# Patient Record
Sex: Male | Born: 1980 | Race: Black or African American | Hispanic: No | Smoking: Never smoker
Health system: Southern US, Community
[De-identification: ages and names within clinical notes are randomized; demographics above are authoritative.]

## PROBLEM LIST (undated history)

## (undated) DIAGNOSIS — E7800 Pure hypercholesterolemia, unspecified: Secondary | ICD-10-CM

## (undated) DIAGNOSIS — C3490 Malignant neoplasm of unspecified part of unspecified bronchus or lung: Secondary | ICD-10-CM

## (undated) DIAGNOSIS — Z87442 Personal history of urinary calculi: Secondary | ICD-10-CM

## (undated) DIAGNOSIS — D3A090 Benign carcinoid tumor of the bronchus and lung: Secondary | ICD-10-CM

## (undated) DIAGNOSIS — F419 Anxiety disorder, unspecified: Secondary | ICD-10-CM

## (undated) DIAGNOSIS — C801 Malignant (primary) neoplasm, unspecified: Secondary | ICD-10-CM

## (undated) DIAGNOSIS — K760 Fatty (change of) liver, not elsewhere classified: Secondary | ICD-10-CM

## (undated) DIAGNOSIS — J45909 Unspecified asthma, uncomplicated: Secondary | ICD-10-CM

## (undated) DIAGNOSIS — F32A Depression, unspecified: Secondary | ICD-10-CM

## (undated) HISTORY — DX: Malignant neoplasm of unspecified part of unspecified bronchus or lung: C34.90

## (undated) HISTORY — PX: WISDOM TOOTH EXTRACTION: SHX21

## (undated) HISTORY — DX: Benign carcinoid tumor of the bronchus and lung: D3A.090

---
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
## 2001-05-01 ENCOUNTER — Emergency Department (HOSPITAL_COMMUNITY): Admission: EM | Admit: 2001-05-01 | Discharge: 2001-05-01 | Payer: Self-pay | Admitting: *Deleted

## 2001-11-05 ENCOUNTER — Encounter: Admission: RE | Admit: 2001-11-05 | Discharge: 2001-11-05 | Payer: Self-pay | Admitting: Family Medicine

## 2001-11-05 ENCOUNTER — Encounter: Payer: Self-pay | Admitting: Family Medicine

## 2003-06-03 ENCOUNTER — Encounter: Admission: RE | Admit: 2003-06-03 | Discharge: 2003-06-03 | Payer: Self-pay | Admitting: Family Medicine

## 2004-03-17 ENCOUNTER — Encounter: Admission: RE | Admit: 2004-03-17 | Discharge: 2004-03-17 | Payer: Self-pay | Admitting: Family Medicine

## 2009-03-22 ENCOUNTER — Emergency Department (HOSPITAL_COMMUNITY): Admission: EM | Admit: 2009-03-22 | Discharge: 2009-03-23 | Payer: Self-pay | Admitting: Emergency Medicine

## 2010-05-16 LAB — URINALYSIS, ROUTINE W REFLEX MICROSCOPIC
Bilirubin Urine: NEGATIVE
Glucose, UA: NEGATIVE mg/dL
Hgb urine dipstick: NEGATIVE
Protein, ur: NEGATIVE mg/dL
Specific Gravity, Urine: 1.027 (ref 1.005–1.030)

## 2010-05-16 LAB — CBC
MCHC: 33.2 g/dL (ref 30.0–36.0)
Platelets: 151 10*3/uL (ref 150–400)
RDW: 12.8 % (ref 11.5–15.5)

## 2010-05-16 LAB — COMPREHENSIVE METABOLIC PANEL
ALT: 97 U/L — ABNORMAL HIGH (ref 0–53)
Albumin: 4.4 g/dL (ref 3.5–5.2)
Alkaline Phosphatase: 73 U/L (ref 39–117)
Calcium: 9.4 mg/dL (ref 8.4–10.5)
GFR calc Af Amer: >60 mL/min (ref >60)
Glucose, Bld: 103 mg/dL — ABNORMAL HIGH (ref 70–99)
Potassium: 3.8 mEq/L (ref 3.5–5.1)
Sodium: 140 mEq/L (ref 135–145)
Total Protein: 7.7 g/dL (ref 6.0–8.3)

## 2010-05-16 LAB — DIFFERENTIAL
Eosinophils Absolute: 0 10*3/uL (ref 0.0–0.7)
Lymphs Abs: 0.3 10*3/uL — ABNORMAL LOW (ref 0.7–4.0)
Monocytes Absolute: 0.4 10*3/uL (ref 0.1–1.0)
Monocytes Relative: 5 % (ref 3–12)
Neutrophils Relative %: 90 % — ABNORMAL HIGH (ref 43–77)

## 2012-08-31 DIAGNOSIS — R030 Elevated blood-pressure reading, without diagnosis of hypertension: Secondary | ICD-10-CM | POA: Insufficient documentation

## 2012-08-31 DIAGNOSIS — R5381 Other malaise: Secondary | ICD-10-CM | POA: Insufficient documentation

## 2012-08-31 DIAGNOSIS — J060 Acute laryngopharyngitis: Secondary | ICD-10-CM | POA: Insufficient documentation

## 2013-01-17 DIAGNOSIS — Z639 Problem related to primary support group, unspecified: Secondary | ICD-10-CM | POA: Insufficient documentation

## 2013-01-17 DIAGNOSIS — J0100 Acute maxillary sinusitis, unspecified: Secondary | ICD-10-CM | POA: Insufficient documentation

## 2013-06-03 DIAGNOSIS — J309 Allergic rhinitis, unspecified: Secondary | ICD-10-CM | POA: Insufficient documentation

## 2013-07-25 DIAGNOSIS — J029 Acute pharyngitis, unspecified: Secondary | ICD-10-CM | POA: Insufficient documentation

## 2013-07-25 DIAGNOSIS — J45909 Unspecified asthma, uncomplicated: Secondary | ICD-10-CM | POA: Insufficient documentation

## 2013-08-14 DIAGNOSIS — K921 Melena: Secondary | ICD-10-CM | POA: Insufficient documentation

## 2013-08-14 DIAGNOSIS — M5430 Sciatica, unspecified side: Secondary | ICD-10-CM | POA: Insufficient documentation

## 2014-02-11 DIAGNOSIS — G479 Sleep disorder, unspecified: Secondary | ICD-10-CM | POA: Insufficient documentation

## 2015-04-22 DIAGNOSIS — H60541 Acute eczematoid otitis externa, right ear: Secondary | ICD-10-CM | POA: Insufficient documentation

## 2015-04-22 DIAGNOSIS — J301 Allergic rhinitis due to pollen: Secondary | ICD-10-CM | POA: Insufficient documentation

## 2015-05-20 DIAGNOSIS — H60313 Diffuse otitis externa, bilateral: Secondary | ICD-10-CM | POA: Insufficient documentation

## 2015-08-30 DIAGNOSIS — S39012A Strain of muscle, fascia and tendon of lower back, initial encounter: Secondary | ICD-10-CM | POA: Insufficient documentation

## 2015-08-30 DIAGNOSIS — M25511 Pain in right shoulder: Secondary | ICD-10-CM | POA: Insufficient documentation

## 2015-09-21 DIAGNOSIS — F5232 Male orgasmic disorder: Secondary | ICD-10-CM | POA: Insufficient documentation

## 2015-09-21 DIAGNOSIS — Z0000 Encounter for general adult medical examination without abnormal findings: Secondary | ICD-10-CM | POA: Insufficient documentation

## 2015-09-23 DIAGNOSIS — E7800 Pure hypercholesterolemia, unspecified: Secondary | ICD-10-CM | POA: Insufficient documentation

## 2015-09-29 ENCOUNTER — Ambulatory Visit: Payer: Self-pay

## 2015-09-29 ENCOUNTER — Other Ambulatory Visit: Payer: Self-pay | Admitting: Occupational Medicine

## 2015-09-29 DIAGNOSIS — M79644 Pain in right finger(s): Secondary | ICD-10-CM

## 2018-10-01 ENCOUNTER — Other Ambulatory Visit: Payer: Self-pay

## 2018-10-01 ENCOUNTER — Ambulatory Visit
Admission: EM | Admit: 2018-10-01 | Discharge: 2018-10-01 | Disposition: A | Discharge status: Home / Self Care | Payer: BC Managed Care – PPO | Attending: Physician Assistant | Admitting: Physician Assistant

## 2018-10-01 ENCOUNTER — Encounter: Payer: Self-pay | Admitting: Emergency Medicine

## 2018-10-01 DIAGNOSIS — D225 Melanocytic nevi of trunk: Secondary | ICD-10-CM | POA: Diagnosis not present

## 2018-10-01 HISTORY — DX: Fatty (change of) liver, not elsewhere classified: K76.0

## 2018-10-01 HISTORY — DX: Pure hypercholesterolemia, unspecified: E78.00

## 2018-10-01 NOTE — ED Provider Notes (Signed)
[name]    [name]: [id number] Arrival date & time: 10/01/18  1506      History   Chief Complaint Chief Complaint  Patient presents with   Mass    HPI Derek Fitzgerald is a 38 y.o. male.   38 year old male comes in for evaluation of mole to the groin area.  States he recently noticed the mole, unsure original size, but feels that it may be slightly larger.  Area is more tender to palpation recently.  He states given father passed away from prostate cancer, wanted to be evaluated for skin cancer.  He denies any injury/trauma to the area.  Denies erythema, warmth, swelling.  Denies sun exposure to the area.  Denies personal or family history of skin cancer.     Past Medical History:  Diagnosis Date   Fatty liver    Hypercholesteremia     There are no active problems to display for this patient.   History reviewed. No pertinent surgical history.     Home Medications    Prior to Admission medications   Not on File    Family History Family History  Problem Relation Age of Onset   Prostate cancer Father     Social History Social History   Tobacco Use   Smoking status: Never Smoker   Smokeless tobacco: Never Used  Substance Use Topics   Alcohol use: Not Currently    Comment: socially   Drug use: Never     Allergies   Patient has no known allergies.   Review of Systems Review of Systems  Reason unable to perform ROS: See HPI as above.     Physical Exam Triage Vital Signs ED Triage Vitals  Enc Vitals Group     BP 10/01/18 1516 (!) 153/111     Pulse Rate 10/01/18 1516 88     Resp 10/01/18 1516 17     Temp 10/01/18 1516 98.4 F (36.9 C)     Temp Source 10/01/18 1516 Oral     SpO2 10/01/18 1516 95 %     Weight --      Height --      Head Circumference --      Peak Flow --      Pain Score 10/01/18 1515 1     Pain Loc --      Pain Edu? --      Excl. in [place]? --    No data found.  Updated Vital Signs BP (!) 153/111 (BP  Location: Right Arm)    Pulse 88    Temp 98.4 F (36.9 C) (Oral)    Resp 17    SpO2 95%   Visual Acuity Right Eye Distance:   Left Eye Distance:   Bilateral Distance:    Right Eye Near:   Left Eye Near:    Bilateral Near:     Physical Exam Constitutional:      General: He is not in acute distress.    Appearance: He is well-developed. He is not diaphoretic.  HENT:     Head: Normocephalic and atraumatic.  Eyes:     Conjunctiva/sclera: Conjunctivae normal.     Pupils: Pupils are equal, round, and reactive to light.  Pulmonary:     Effort: Pulmonary effort is normal. No respiratory distress.  Genitourinary:   Skin:    General: Skin is warm and dry.  Neurological:     Mental Status: He is alert and oriented to person, place, and time.  UC Treatments / Results  Labs (all labs ordered are listed, but only abnormal results are displayed) Labs Reviewed - No data to display  EKG   Radiology No results found.  Procedures Procedures (including critical care time)  Medications Ordered in UC Medications - No data to display  Initial Impression / Assessment and Plan / UC Course  I have reviewed the triage vital signs and the nursing notes.  Pertinent labs & imaging results that were available during my care of the patient were reviewed by me and considered in my medical decision making (see chart for details).    Discussed given round/smooth shape, lower suspicion for cancerous lesions.  Discussed possibility of irritation from friction caused by pants/belt.  However, given location, difficult for patient to visualize and monitor.  Will have patient follow-up with dermatology for further evaluation and management needed.  Return precautions given.  Patient expresses understanding and agrees to plan.  Final Clinical Impressions(s) / UC Diagnoses   Final diagnoses:  Nevus of groin    ED Prescriptions    None        Ok Edwards, PA-C 10/01/18 1625

## 2018-10-01 NOTE — ED Triage Notes (Addendum)
Pt presents to [facility] for right assessment of growth/mole to hip, above groin area.  Patient states it has been there for a while, but it recently has grown some, and is more noticeably tender.  Patient states his father died of prostate cancer a few years ago, which is why he is more concerned than normal.

## 2018-10-01 NOTE — ED Notes (Signed)
Patient able to ambulate independently  

## 2018-10-01 NOTE — Discharge Instructions (Signed)
Given round/smooth shape, lower suspicion for cancerous lesions. However, given unable to see location clearly, and worries for increased size, follow up with dermatology for further evaluation needed.

## 2018-11-22 ENCOUNTER — Encounter: Payer: Self-pay | Admitting: Emergency Medicine

## 2018-11-22 ENCOUNTER — Other Ambulatory Visit: Payer: Self-pay

## 2018-11-22 ENCOUNTER — Ambulatory Visit
Admission: EM | Admit: 2018-11-22 | Discharge: 2018-11-22 | Disposition: A | Discharge status: Home / Self Care | Payer: BC Managed Care – PPO | Attending: Emergency Medicine | Admitting: Emergency Medicine

## 2018-11-22 DIAGNOSIS — R062 Wheezing: Secondary | ICD-10-CM | POA: Diagnosis not present

## 2018-11-22 DIAGNOSIS — R51 Headache: Secondary | ICD-10-CM | POA: Diagnosis not present

## 2018-11-22 DIAGNOSIS — Z20828 Contact with and (suspected) exposure to other viral communicable diseases: Secondary | ICD-10-CM

## 2018-11-22 DIAGNOSIS — Z20822 Contact with and (suspected) exposure to covid-19: Secondary | ICD-10-CM

## 2018-11-22 NOTE — ED Triage Notes (Signed)
Pt presents to [facility] for assessment of nasal congestion, post-nasal drip x 2-3 weeks.  Then c/o of increased wheeziness (hx of asthma) x 2-3 days and has been out of work.   Denies using him home inhaler for the wheezing.  C/o coughing up bloody mucous on Wednesday morning, first thing, and some blood when blowing nose.

## 2018-11-22 NOTE — ED Provider Notes (Signed)
[name]    [name]: [id number] Arrival date & time: 11/22/18  1510      History   Chief Complaint Chief Complaint  Patient presents with  . Nasal Congestion    HPI Derek Fitzgerald is a 38 y.o. male with history of hypercholesterolemia, fatty liver presenting for 2 to 3-week course of nasal congestion, postnasal drip, intermittent frontal headaches.  Patient also has had intermittent cough that is sometimes productive, had a single episode of epistaxis Tuesday evening and "coughed up a little blood clot the next morning ".  Patient has had a cough since then, though it has not persistent, and without hemoptysis.  States he is taken his Claritin, Flonase sometimes with relief of nasal congestion, postnasal drip.  Has taken ibuprofen with successful relief of headache.  Also endorsing 2 to 3-day course of increased wheezing with exertion, though has not used his home albuterol for this.  Denies shortness of breath, chest pain, fever, known COVID-19 contact.  Past Medical History:  Diagnosis Date  . Fatty liver   . Hypercholesteremia     There are no active problems to display for this patient.   History reviewed. No pertinent surgical history.     Home Medications    Prior to Admission medications   Not on File    Family History Family History  Problem Relation Age of Onset  . Prostate cancer Father     Social History Social History   Tobacco Use  . Smoking status: Never Smoker  . Smokeless tobacco: Never Used  Substance Use Topics  . Alcohol use: Not Currently    Comment: socially  . Drug use: Never     Allergies   Patient has no known allergies.   Review of Systems Review of Systems  Constitutional: Negative for activity change, appetite change, fatigue and fever.  HENT: Positive for congestion, postnasal drip and rhinorrhea. Negative for dental problem, ear pain, facial swelling, hearing loss, sinus pain, sore throat, trouble swallowing and  voice change.   Eyes: Negative for photophobia, pain and visual disturbance.  Respiratory: Positive for cough and wheezing. Negative for choking, chest tightness, shortness of breath and stridor.   Cardiovascular: Negative for chest pain, palpitations and leg swelling.  Gastrointestinal: Negative for abdominal pain, diarrhea, nausea and vomiting.  Musculoskeletal: Negative for arthralgias and myalgias.  Skin: Negative for rash and wound.  Neurological: Negative for dizziness, speech difficulty and headaches.  All other systems reviewed and are negative.    Physical Exam Triage Vital Signs ED Triage Vitals  Enc Vitals Group     BP 11/22/18 1524 (!) 149/102     Pulse Rate 11/22/18 1524 95     Resp 11/22/18 1524 18     Temp 11/22/18 1524 98.5 F (36.9 C)     Temp Source 11/22/18 1524 Oral     SpO2 11/22/18 1524 95 %     Weight --      Height --      Head Circumference --      Peak Flow --      Pain Score 11/22/18 1525 2     Pain Loc --      Pain Edu? --      Excl. in [place]? --    No data found.  Updated Vital Signs BP (!) 133/95 (BP Location: Left Arm)   Pulse 95   Temp 98.5 F (36.9 C) (Oral)   Resp 18   SpO2 95%    Physical  Exam Constitutional:      General: He is not in acute distress.    Appearance: He is obese. He is not ill-appearing.  HENT:     Head: Normocephalic and atraumatic.     Right Ear: Tympanic membrane, ear canal and external ear normal.     Left Ear: Tympanic membrane, ear canal and external ear normal.     Nose:     Comments: Bilateral turbinate edema with slight mucosal pallor    Mouth/Throat:     Mouth: Mucous membranes are moist.     Comments: Cobblestoning present Eyes:     General: No scleral icterus.    Conjunctiva/sclera: Conjunctivae normal.     Pupils: Pupils are equal, round, and reactive to light.  Neck:     Musculoskeletal: Normal range of motion and neck supple. No muscular tenderness.  Cardiovascular:     Rate and Rhythm:  Normal rate and regular rhythm.     Heart sounds: No murmur. No gallop.   Pulmonary:     Effort: Pulmonary effort is normal. No respiratory distress.     Breath sounds: No stridor. No wheezing, rhonchi or rales.  Abdominal:     General: Bowel sounds are normal.     Tenderness: There is no abdominal tenderness.  Lymphadenopathy:     Cervical: No cervical adenopathy.  Skin:    General: Skin is warm.     Capillary Refill: Capillary refill takes less than 2 seconds.     Coloration: Skin is not jaundiced or pale.  Neurological:     Mental Status: He is alert and oriented to person, place, and time.      UC Treatments / Results  Labs (all labs ordered are listed, but only abnormal results are displayed) Labs Reviewed  NOVEL CORONAVIRUS, NAA    EKG   Radiology No results found.  Procedures Procedures (including critical care time)  Medications Ordered in UC Medications - No data to display  Initial Impression / Assessment and Plan / UC Course  I have reviewed the triage vital signs and the nursing notes.  Pertinent labs & imaging results that were available during my care of the patient were reviewed by me and considered in my medical decision making (see chart for details).     1.  Suspected COVID-19 virus infection Test pending, patient to quarantine until resulted.  Discussed that this could be seasonal allergies later due to recent trigger exposure, though headaches, wheezing more concerning.  Patient to trial albuterol to see if this helps.  Return precautions discussed, patient verbalized understanding and is agreeable to plan.  Final Clinical Impressions(s) / UC Diagnoses   Final diagnoses:  Suspected Covid-19 Virus Infection     Discharge Instructions     Your COVID test is pending: Is important you quarantine at home until your results are back. You may take OTC Tylenol for fever and myalgias.  It is important to drink plenty of water throughout the day to  stay hydrated. If you test positive and later develop severe fever, cough, or shortness of breath, it is recommended that you go to the ER for further evaluation.    ED Prescriptions    None     PDMP not reviewed this encounter.   Hall[place], [place], [place] 11/22/18 1627

## 2018-11-22 NOTE — Discharge Instructions (Signed)
Your COVID test is pending: Is important you quarantine at home until your results are back. You may take OTC Tylenol for fever and myalgias.  It is important to drink plenty of water throughout the day to stay hydrated. If you test positive and later develop severe fever, cough, or shortness of breath, it is recommended that you go to the ER for further evaluation.

## 2018-11-23 LAB — NOVEL CORONAVIRUS, NAA: SARS-CoV-2, NAA: NOT DETECTED

## 2019-02-26 ENCOUNTER — Other Ambulatory Visit: Payer: Self-pay

## 2019-02-26 ENCOUNTER — Ambulatory Visit
Admission: EM | Admit: 2019-02-26 | Discharge: 2019-02-26 | Disposition: A | Discharge status: Home / Self Care | Payer: BC Managed Care – PPO | Attending: Physician Assistant | Admitting: Physician Assistant

## 2019-02-26 DIAGNOSIS — X500XXA Overexertion from strenuous movement or load, initial encounter: Secondary | ICD-10-CM | POA: Diagnosis not present

## 2019-02-26 DIAGNOSIS — S46211A Strain of muscle, fascia and tendon of other parts of biceps, right arm, initial encounter: Secondary | ICD-10-CM

## 2019-02-26 MED ORDER — METHOCARBAMOL 500 MG PO TABS: 500.0000 mg | ORAL_TABLET | Freq: Two times a day (BID) | ORAL | 0 refills | Status: DC

## 2019-02-26 MED ORDER — MELOXICAM 7.5 MG PO TABS: 7.5000 mg | ORAL_TABLET | Freq: Every day | ORAL | 0 refills | Status: DC

## 2019-02-26 NOTE — ED Triage Notes (Signed)
Pt c/o rt arm pain after lifting a mattress. States feels like a burn and cramp feeling in his muscle.

## 2019-02-26 NOTE — Discharge Instructions (Signed)
Start Mobic. Do not take ibuprofen (motrin/advil)/ naproxen (aleve) while on mobic. Robaxin as needed, this can make you drowsy, so do not take if you are going to drive, operate heavy machinery, or make important decisions. Ice/heat compresses as needed. This can take up to 3-4 weeks to completely resolve, but you should be feeling better each week. Follow up with PCP/orthopedics if symptoms worsen, changes for reevaluation.

## 2019-02-26 NOTE — ED Provider Notes (Signed)
[name]    [name]: [id number] Arrival date & time: 02/26/19  1424      History   Chief Complaint Chief Complaint  Patient presents with  . Arm Injury    HPI Derek Fitzgerald is a 38 y.o. male.   38 year old male comes in for 2 day history of right upper arm pain after heavy lifting. States he was helping friend move a mattress when he felt pain to the upper arm. After continued movement/lifting, heard popping/cracking noises. He has since then had pain along the bicep area. States with rest, he can avoid pain. Otherwise painful movement with occasional radiation down the arm. Denies loss of grip strength.      Past Medical History:  Diagnosis Date  . Fatty liver   . Hypercholesteremia     There are no problems to display for this patient.   History reviewed. No pertinent surgical history.     Home Medications    Prior to Admission medications   Medication Sig Start Date End Date Taking? Authorizing Provider  meloxicam (MOBIC) 7.5 MG tablet Take 1 tablet (7.5 mg total) by mouth daily. 02/26/19   Tasia Catchings, Perley Arthurs V, PA-C  methocarbamol (ROBAXIN) 500 MG tablet Take 1 tablet (500 mg total) by mouth 2 (two) times daily. 02/26/19   Ok Edwards, PA-C    Family History Family History  Problem Relation Age of Onset  . Prostate cancer Father     Social History Social History   Tobacco Use  . Smoking status: Never Smoker  . Smokeless tobacco: Never Used  Substance Use Topics  . Alcohol use: Not Currently    Comment: socially  . Drug use: Never     Allergies   Patient has no known allergies.   Review of Systems Review of Systems  Reason unable to perform ROS: See HPI as above.     Physical Exam Triage Vital Signs ED Triage Vitals [02/26/19 1435]  Enc Vitals Group     BP (!) 146/90     Pulse Rate 85     Resp 18     Temp 98.4 F (36.9 C)     Temp Source Oral     SpO2 95 %     Weight      Height      Head Circumference      Peak Flow    Pain Score 0     Pain Loc      Pain Edu?      Excl. in [place]?    No data found.  Updated Vital Signs BP (!) 146/90 (BP Location: Left Arm)   Pulse 85   Temp 98.4 F (36.9 C) (Oral)   Resp 18   SpO2 95%   Physical Exam Constitutional:      General: He is not in acute distress.    Appearance: He is well-developed. He is not diaphoretic.  HENT:     Head: Normocephalic and atraumatic.  Eyes:     Conjunctiva/sclera: Conjunctivae normal.     Pupils: Pupils are equal, round, and reactive to light.  Pulmonary:     Effort: Pulmonary effort is normal. No respiratory distress.  Musculoskeletal:     Cervical back: Normal range of motion and neck supple.     Comments: No contusion, swelling, erythema, warmth. No deformity to the arm. No tenderness to palpation of the thoracic back, shoulder. Tenderness to palpation along right bicep. Full ROM of shoulder, elbow, wrist. Strength normal  and equal bilaterally. Normal grip strength. Sensation intact and equal bilaterally. Radial pulse 2+, cap refill <2s  Skin:    General: Skin is warm and dry.  Neurological:     Mental Status: He is alert and oriented to person, place, and time.      UC Treatments / Results  Labs (all labs ordered are listed, but only abnormal results are displayed) Labs Reviewed - No data to display  EKG   Radiology No results found.  Procedures Procedures (including critical care time)  Medications Ordered in UC Medications - No data to display  Initial Impression / Assessment and Plan / UC Course  I have reviewed the triage vital signs and the nursing notes.  Pertinent labs & imaging results that were available during my care of the patient were reviewed by me and considered in my medical decision making (see chart for details).    Low suspicion for bicep tendon rupture given no contusion, deformity seen with full range of motion. Discussed current history and exam most consistent with bicep tendon strain.  Mobic, muscle relaxers as directed. Return precautions given. Patient expresses understanding and agrees to plan.  Final Clinical Impressions(s) / UC Diagnoses   Final diagnoses:  Strain of right biceps, initial encounter    ED Prescriptions    Medication Sig Dispense Auth. Provider   meloxicam (MOBIC) 7.5 MG tablet Take 1 tablet (7.5 mg total) by mouth daily. 10 tablet Denelle Capurro V, PA-C   methocarbamol (ROBAXIN) 500 MG tablet Take 1 tablet (500 mg total) by mouth 2 (two) times daily. 20 tablet Ok Edwards, PA-C     PDMP not reviewed this encounter.   Ok Edwards, PA-C 02/26/19 1521

## 2019-12-08 ENCOUNTER — Other Ambulatory Visit: Payer: Self-pay | Admitting: Urgent Care

## 2019-12-08 DIAGNOSIS — R945 Abnormal results of liver function studies: Secondary | ICD-10-CM

## 2019-12-17 ENCOUNTER — Ambulatory Visit
Admission: RE | Admit: 2019-12-17 | Discharge: 2019-12-17 | Disposition: A | Discharge status: Home / Self Care | Payer: BC Managed Care – PPO | Source: Ambulatory Visit | Attending: Urgent Care | Admitting: Urgent Care

## 2019-12-17 DIAGNOSIS — R945 Abnormal results of liver function studies: Secondary | ICD-10-CM

## 2020-10-30 ENCOUNTER — Encounter (HOSPITAL_COMMUNITY): Payer: Self-pay

## 2020-10-30 ENCOUNTER — Other Ambulatory Visit: Payer: Self-pay

## 2020-10-30 ENCOUNTER — Emergency Department (HOSPITAL_COMMUNITY): Payer: BC Managed Care – PPO

## 2020-10-30 ENCOUNTER — Emergency Department (HOSPITAL_COMMUNITY)
Admission: EM | Admit: 2020-10-30 | Discharge: 2020-10-30 | Disposition: A | Discharge status: Home / Self Care | Payer: BC Managed Care – PPO | Attending: Emergency Medicine | Admitting: Emergency Medicine

## 2020-10-30 DIAGNOSIS — N200 Calculus of kidney: Secondary | ICD-10-CM | POA: Diagnosis not present

## 2020-10-30 DIAGNOSIS — R109 Unspecified abdominal pain: Secondary | ICD-10-CM | POA: Diagnosis present

## 2020-10-30 LAB — CBC WITH DIFFERENTIAL/PLATELET
Abs Immature Granulocytes: 0.02 10*3/uL (ref 0.00–0.07)
Basophils Absolute: 0.1 10*3/uL (ref 0.0–0.1)
Basophils Relative: 1 %
Eosinophils Absolute: 0.1 10*3/uL (ref 0.0–0.5)
Eosinophils Relative: 1 %
HCT: 51.1 % (ref 39.0–52.0)
Hemoglobin: 17.1 g/dL — ABNORMAL HIGH (ref 13.0–17.0)
Immature Granulocytes: 0 %
Lymphocytes Relative: 23 %
Lymphs Abs: 1.9 10*3/uL (ref 0.7–4.0)
MCH: 30.2 pg (ref 26.0–34.0)
MCHC: 33.5 g/dL (ref 30.0–36.0)
MCV: 90.3 fL (ref 80.0–100.0)
Monocytes Absolute: 0.6 10*3/uL (ref 0.1–1.0)
Monocytes Relative: 8 %
Neutro Abs: 5.4 10*3/uL (ref 1.7–7.7)
Neutrophils Relative %: 67 %
Platelets: UNDETERMINED 10*3/uL (ref 150–400)
RBC: 5.66 MIL/uL (ref 4.22–5.81)
RDW: 12.9 % (ref 11.5–15.5)
Smear Review: UNDETERMINED
WBC: 8.1 10*3/uL (ref 4.0–10.5)
nRBC: 0 % (ref 0.0–0.2)

## 2020-10-30 LAB — URINALYSIS, ROUTINE W REFLEX MICROSCOPIC
Bilirubin Urine: NEGATIVE
Glucose, UA: NEGATIVE mg/dL
Ketones, ur: NEGATIVE mg/dL
Leukocytes,Ua: NEGATIVE
Nitrite: NEGATIVE
Protein, ur: NEGATIVE mg/dL
RBC / HPF: >50 RBC/hpf — ABNORMAL HIGH (ref 0–5)
Specific Gravity, Urine: 1.009 (ref 1.005–1.030)
pH: 6 (ref 5.0–8.0)

## 2020-10-30 LAB — COMPREHENSIVE METABOLIC PANEL
ALT: 129 U/L — ABNORMAL HIGH (ref 0–44)
AST: 50 U/L — ABNORMAL HIGH (ref 15–41)
Albumin: 4.2 g/dL (ref 3.5–5.0)
Alkaline Phosphatase: 79 U/L (ref 38–126)
Anion gap: 8 (ref 5–15)
BUN: 8 mg/dL (ref 6–20)
CO2: 20 mmol/L — ABNORMAL LOW (ref 22–32)
Calcium: 9.5 mg/dL (ref 8.9–10.3)
Chloride: 111 mmol/L (ref 98–111)
Creatinine, Ser: 1.06 mg/dL (ref 0.61–1.24)
GFR, Estimated: >60 mL/min (ref >60)
Glucose, Bld: 109 mg/dL — ABNORMAL HIGH (ref 70–99)
Potassium: 4.2 mmol/L (ref 3.5–5.1)
Sodium: 139 mmol/L (ref 135–145)
Total Bilirubin: 1.1 mg/dL (ref 0.3–1.2)
Total Protein: 7.4 g/dL (ref 6.5–8.1)

## 2020-10-30 LAB — LIPASE, BLOOD: Lipase: 29 U/L (ref 11–51)

## 2020-10-30 MED ORDER — FENTANYL CITRATE PF 50 MCG/ML IJ SOSY
50.0000 ug | PREFILLED_SYRINGE | Freq: Once | INTRAMUSCULAR | Status: AC
Start: 2020-10-30 — End: 2020-10-30
  Administered 2020-10-30: 50 ug via INTRAVENOUS
  Filled 2020-10-30: qty 1

## 2020-10-30 MED ORDER — TAMSULOSIN HCL 0.4 MG PO CAPS: 0.4000 mg | ORAL_CAPSULE | Freq: Every day | ORAL | 0 refills | Status: DC

## 2020-10-30 MED ORDER — IOHEXOL 350 MG/ML SOLN
100.0000 mL | Freq: Once | INTRAVENOUS | Status: AC | PRN
  Administered 2020-10-30: 100 mL via INTRAVENOUS

## 2020-10-30 MED ORDER — KETOROLAC TROMETHAMINE 15 MG/ML IJ SOLN
15.0000 mg | Freq: Once | INTRAMUSCULAR | Status: AC
  Administered 2020-10-30: 15 mg via INTRAVENOUS
  Filled 2020-10-30: qty 1

## 2020-10-30 MED ORDER — OXYCODONE-ACETAMINOPHEN 5-325 MG PO TABS: 1.0000 | ORAL_TABLET | Freq: Four times a day (QID) | ORAL | 0 refills | Status: AC | PRN

## 2020-10-30 MED ORDER — NAPROXEN 500 MG PO TABS: 500.0000 mg | ORAL_TABLET | Freq: Two times a day (BID) | ORAL | 0 refills | Status: DC | PRN

## 2020-10-30 MED ORDER — ONDANSETRON HCL 4 MG/2ML IJ SOLN
4.0000 mg | Freq: Once | INTRAMUSCULAR | Status: AC
Start: 2020-10-30 — End: 2020-10-30
  Administered 2020-10-30: 4 mg via INTRAVENOUS
  Filled 2020-10-30: qty 2

## 2020-10-30 NOTE — ED Triage Notes (Signed)
Patient complains of lower abdominal pain that started this am when he awoke, no nausea, no vomiting. Had normal BM this am. Denies fever, no chills. States that the pain radiates down left leg

## 2020-10-30 NOTE — Discharge Instructions (Addendum)
Follow-up with urology.  Take Tylenol and anti-inflammatory such as the prescribed naproxen as needed for pain control.  For breakthrough pain take the prescribed Percocet.  Note this can be addressed and should not be taken on driving or operating heavy machinery.  Also recommend taking Flomax and drinking plenty of fluids to encourage your kidney stone pass.  If you develop fever, uncontrolled pain, vomiting, come back to ER for reassessment.

## 2020-10-30 NOTE — ED Provider Notes (Signed)
[facility] EMER[facility]ENC[facility] DEPARTMENT Provider Note   [name]: [id number] Arrival date & time: 10/30/20  1257     History No chief complaint on file.   Derek Fitzgerald is a 40 y.o. male.  Presents here with concern for abdominal pain.  Pain on the left side of abdomen, radiates to left groin.  Pain seems to come and go in waves.  Currently moderate, sharp and stabbing.  Has never had pain like this before.  No vomiting.  No fever.  HPI     Past Medical History:  Diagnosis Date   Fatty liver    Hypercholesteremia     There are no problems to display for this patient.   History reviewed. No pertinent surgical history.     Family History  Problem Relation Age of Onset   Prostate cancer Father     Social History   Tobacco Use   Smoking status: Never   Smokeless tobacco: Never  Substance Use Topics   Alcohol use: Not Currently    Comment: socially   Drug use: Never    Home Medications Prior to Admission medications   Medication Sig Start Date End Date Taking? Authorizing Provider  naproxen (NAPROSYN) 500 MG tablet Take 1 tablet (500 mg total) by mouth 2 (two) times daily as needed. 10/30/20  Yes Lucrezia Starch, MD  oxyCODONE-acetaminophen (PERCOCET/ROXICET) 5-325 MG tablet Take 1 tablet by mouth every 6 (six) hours as needed for up to 3 days for severe pain. 10/30/20 11/02/20 Yes Jrake Rodriquez, Ellwood Dense, MD  tamsulosin (FLOMAX) 0.4 MG CAPS capsule Take 1 capsule (0.4 mg total) by mouth daily. 10/30/20  Yes Lucrezia Starch, MD  meloxicam (MOBIC) 7.5 MG tablet Take 1 tablet (7.5 mg total) by mouth daily. 02/26/19   Tasia Catchings, Amy V, PA-C  methocarbamol (ROBAXIN) 500 MG tablet Take 1 tablet (500 mg total) by mouth 2 (two) times daily. 02/26/19   Ok Edwards, PA-C    Allergies    Patient has no known allergies.  Review of Systems   Review of Systems  Constitutional:  Negative for chills and fever.  HENT:  Negative for ear pain and sore throat.   Eyes:  Negative for pain  and visual disturbance.  Respiratory:  Negative for cough and shortness of breath.   Cardiovascular:  Negative for chest pain and palpitations.  Gastrointestinal:  Positive for abdominal pain. Negative for vomiting.  Genitourinary:  Positive for flank pain. Negative for dysuria and hematuria.  Musculoskeletal:  Negative for arthralgias and back pain.  Skin:  Negative for color change and rash.  Neurological:  Negative for seizures and syncope.  All other systems reviewed and are negative.  Physical Exam Updated Vital Signs BP (!) 153/109 (BP Location: Left Arm)   Pulse 84   Temp 98.6 F (37 C) (Oral)   Resp 16   Ht 5\' 10"  (1.778 m)   Wt 113.4 kg   SpO2 99%   BMI 35.87 kg/m   Physical Exam Vitals and nursing note reviewed.  Constitutional:      Appearance: He is well-developed.  HENT:     Head: Normocephalic and atraumatic.  Eyes:     Conjunctiva/sclera: Conjunctivae normal.  Cardiovascular:     Rate and Rhythm: Normal rate and regular rhythm.     Heart sounds: No murmur heard. Pulmonary:     Effort: Pulmonary effort is normal. No respiratory distress.     Breath sounds: Normal breath sounds.  Abdominal:  Palpations: Abdomen is soft.     Tenderness: There is abdominal tenderness. There is no guarding or rebound.     Comments: Tenderness to palpation left lower quadrant  Genitourinary:    Comments: RN chaperone Testicles and penis appear grossly normal, cremasteric reflex intact, no tenderness to palpation of bilateral testicles Musculoskeletal:     Cervical back: Neck supple.  Skin:    General: Skin is warm and dry.  Neurological:     Mental Status: He is alert.    ED Results / Procedures / Treatments   Labs (all labs ordered are listed, but only abnormal results are displayed) Labs Reviewed  CBC WITH DIFFERENTIAL/PLATELET - Abnormal; Notable for the following components:      Result Value   Hemoglobin 17.1 (*)    All other components within normal limits   COMPREHENSIVE METABOLIC PANEL - Abnormal; Notable for the following components:   CO2 20 (*)    Glucose, Bld 109 (*)    AST 50 (*)    ALT 129 (*)    All other components within normal limits  URINALYSIS, ROUTINE W REFLEX MICROSCOPIC - Abnormal; Notable for the following components:   Hgb urine dipstick LARGE (*)    RBC / HPF >50 (*)    Bacteria, UA RARE (*)    All other components within normal limits  LIPASE, BLOOD    EKG None  Radiology CT ABDOMEN PELVIS W CONTRAST  Result Date: 10/30/2020 CLINICAL DATA:  Lower abdominal pain since this a.m. EXAM: CT ABDOMEN AND PELVIS WITH CONTRAST TECHNIQUE: Multidetector CT imaging of the abdomen and pelvis was performed using the standard protocol following bolus administration of intravenous contrast. CONTRAST:  120mL OMNIPAQUE IOHEXOL 350 MG/ML SOLN COMPARISON:  Ultrasound December 17, 2019 and CT March 23, 2009 FINDINGS: Lower chest: No acute abnormality. Hepatobiliary: Diffuse hepatic steatosis. Gallbladder is unremarkable. No biliary ductal dilation. Pancreas: No evidence of acute inflammation or pancreatic ductal dilation. Spleen: Within normal limits. Adrenals/Urinary Tract: Bilateral adrenal glands are unremarkable. No right hydronephrosis. Periureteral stranding with very mild left hydroureteronephrosis to the level of a 2 mm stone in the distal left ureter just proximal to the ureterovesicular junction on image 73/3. There are 2 nonobstructive left lower pole renal stones measuring up to 2 mm. Punctate nonobstructive right interpolar renal stone. Stomach/Bowel: Stomach is within normal limits. Appendix appears normal. Scattered colonic diverticulosis without findings of acute diverticulitis. No evidence of bowel wall thickening, distention, or inflammatory changes. Vascular/Lymphatic: No abdominal aortic aneurysm. No pathologically enlarged abdominal or pelvic lymph nodes. Reproductive: Prostate is unremarkable. Other: No significant  abdominopelvic ascites. No pneumoperitoneum. No walled off fluid collections. 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 No acute osseous abnormality. IMPRESSION: 1. Very mild left hydroureteronephrosis to the level of a 2 mm stone in the distal left ureter just proximal to the ureterovesicular junction. 2. Additional bilateral nonobstructive nephrolithiasis. 3. Diffuse hepatic steatosis. 4. Colonic diverticulosis without findings of acute diverticulitis. 5. 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 Electronically Signed   By: Dahlia Bailiff M.D.   On: 10/30/2020 19:33    Procedures Procedures   Medications Ordered in ED Medications  fentaNYL (SUBLIMAZE) injection 50 mcg (50 mcg Intravenous Given 10/30/20 1847)  ondansetron (ZOFRAN) injection 4 mg (4 mg Intravenous Given 10/30/20 1846)  iohexol (OMNIPAQUE) 350 MG/ML injection 100 mL (100 mLs Intravenous Contrast Given 10/30/20 1924)  ketorolac (TORADOL) 15 MG/ML injection 15 mg (15 mg Intravenous Given 10/30/20 1953)    ED Course  I have reviewed the triage vital signs and the nursing notes.  Pertinent labs & imaging results that were available during my care of the patient were reviewed by me and considered in my medical decision making (see chart for details).    MDM Rules/Calculators/A&P                           39 year old male presented to ER with concern for left lower abdominal pain radiating into groin region.  On exam he appears well in no distress.  Given he reported pain that radiated to the groin region, checked GU exam which was benign.  Basic labs stable.  UA with hematuria but no infection.  CT scan concerning for small ureteral stone.  Suspect this is culprit for his symptoms today.  Pain is well controlled, no fever.  Appropriate for outpatient management.  Recommend follow-up with urology.  After the discussed management above, the patient was determined to be safe for discharge.  The patient  was in agreement with this plan and all questions regarding their care were answered.  ED return precautions were discussed and the patient will return to the ED with any significant worsening of condition.  Final Clinical Impression(s) / ED Diagnoses Final diagnoses:  Kidney stone    Rx / DC Orders ED Discharge Orders          Ordered    tamsulosin (FLOMAX) 0.4 MG CAPS capsule  Daily        10/30/20 2019    naproxen (NAPROSYN) 500 MG tablet  2 times daily PRN        10/30/20 2019    oxyCODONE-acetaminophen (PERCOCET/ROXICET) 5-325 MG tablet  Every 6 hours PRN        10/30/20 2019             Lucrezia Starch, MD 10/31/20 1450

## 2020-10-30 NOTE — ED Provider Notes (Signed)
Emergency Medicine Provider Triage Evaluation Note  Derek Fitzgerald , a 40 y.o. male  was evaluated in triage.  Pt complains of abdominal pain. States pain started this morning when he woke up. Felt like he needed to have a bowel movement, but when he did it made the pain worse. Bowel movement was normal. Pain is localized to the left lower quadrant accompanied by shooting pain down his inner leg that he states feels like sciatica he had in the past.  Review of Systems  Positive: Abdominal pain Negative: Fevers, chills, Nausea, vomiting, diarrhea, hematochezia  Physical Exam  BP (!) 148/91 (BP Location: Left Arm)   Pulse 92   Temp 98.6 F (37 C) (Oral)   Resp 16   SpO2 96%  Gen:   Awake, no distress  Resp:  Normal effort MSK:   Moves extremities without difficulty Other:  Abdomen soft, tender in left lower quadrant. Murphy's and McBurney's negative. No rebound tenderness.  Medical Decision Making  Medically screening exam initiated at 1:36 PM.  Appropriate orders placed.  DYLAN MONFORTE was informed that the remainder of the evaluation will be completed by another provider, this initial triage assessment does not replace that evaluation, and the importance of remaining in the ED until their evaluation is complete.    Nestor Lewandowsky 10/30/20 1342    Luna Fuse, MD 11/03/20 [phone]

## 2020-12-08 ENCOUNTER — Ambulatory Visit
Admission: EM | Admit: 2020-12-08 | Discharge: 2020-12-08 | Disposition: A | Discharge status: Home / Self Care | Payer: BC Managed Care – PPO | Attending: Internal Medicine | Admitting: Internal Medicine

## 2020-12-08 ENCOUNTER — Encounter: Payer: Self-pay | Admitting: Emergency Medicine

## 2020-12-08 ENCOUNTER — Other Ambulatory Visit: Payer: Self-pay

## 2020-12-08 DIAGNOSIS — J029 Acute pharyngitis, unspecified: Secondary | ICD-10-CM | POA: Diagnosis not present

## 2020-12-08 DIAGNOSIS — J069 Acute upper respiratory infection, unspecified: Secondary | ICD-10-CM | POA: Insufficient documentation

## 2020-12-08 DIAGNOSIS — Z20822 Contact with and (suspected) exposure to covid-19: Secondary | ICD-10-CM | POA: Insufficient documentation

## 2020-12-08 DIAGNOSIS — R062 Wheezing: Secondary | ICD-10-CM | POA: Diagnosis not present

## 2020-12-08 LAB — POCT RAPID STREP A (OFFICE): Rapid Strep A Screen: NEGATIVE

## 2020-12-08 MED ORDER — ALBUTEROL SULFATE HFA 108 (90 BASE) MCG/ACT IN AERS: 1.0000 | INHALATION_SPRAY | Freq: Four times a day (QID) | RESPIRATORY_TRACT | 0 refills | Status: DC | PRN

## 2020-12-08 MED ORDER — PREDNISONE 20 MG PO TABS: 40.0000 mg | ORAL_TABLET | Freq: Every day | ORAL | 0 refills | Status: AC

## 2020-12-08 NOTE — ED Triage Notes (Signed)
Patient c/o possible sinus infection, nasal drainage and congestion, sore throat x 1 week.  Patient has taken Benadryl, Mucinex, Flonase and Robitussin.  Patient is vaccinated for COVID.

## 2020-12-08 NOTE — Discharge Instructions (Addendum)
You likely having a viral upper respiratory infection. We recommended symptom control. I expect your symptoms to start improving in the next 1-2 weeks.   1. Take a daily allergy pill/anti-histamine like Zyrtec, Claritin, or Store brand consistently for 2 weeks  2. For congestion you may try an oral decongestant like Mucinex or sudafed. You may also try intranasal flonase nasal spray or saline irrigations (neti pot, sinus cleanse)  3. For your sore throat you may try cepacol lozenges, salt water gargles, throat spray. Treatment of congestion may also help your sore throat.  4. For cough you may try Robitussen, Mucinex DM  5. Take Tylenol or Ibuprofen to help with pain/inflammation  6. Stay hydrated, drink plenty of fluids to keep throat coated and less irritated  Honey Tea For cough/sore throat try using a honey-based tea. Use 3 teaspoons of honey with juice squeezed from half lemon. Place shaved pieces of ginger into 1/2-1 cup of water and warm over stove top. Then mix the ingredients and repeat every 4 hours as needed.   Your rapid strep test was negative.  Throat culture and COVID-19 viral swab are pending.  We will call if they are positive.  You have been prescribed prednisone steroid and albuterol inhaler.  Please take albuterol inhaler as soon as you receive it from the pharmacy and then as needed.  Please avoid taking any ibuprofen, naproxen, meloxicam if able while taking prednisone.

## 2020-12-08 NOTE — ED Provider Notes (Signed)
[facility]    [name]: [id number] Arrival date & time: 12/08/20  1719      History   Chief Complaint Chief Complaint  Patient presents with   Sinus Infection    HPI Derek Fitzgerald is a 40 y.o. male.   Patient presents with nasal drainage, nasal congestion, sore throat, cough that has been present for 1 week.  Cough is nonproductive. Patient does endorse wheezing but denies any shortness of breath.  Patient has history of asthma but "has not had issues for quite some time".  Denies any known fevers or sick contacts.  Has been taking Benadryl, Mucinex, Flonase, Robitussin with minimal improvement of symptoms.  Denies chest pain.    Past Medical History:  Diagnosis Date   Fatty liver    Hypercholesteremia     There are no problems to display for this patient.   History reviewed. No pertinent surgical history.     Home Medications    Prior to Admission medications   Medication Sig Start Date End Date Taking? Authorizing Provider  albuterol (VENTOLIN HFA) 108 (90 Base) MCG/ACT inhaler Inhale 1-2 puffs into the lungs every 6 (six) hours as needed for wheezing or shortness of breath. 12/08/20  Yes Odis Luster, FNP  escitalopram (LEXAPRO) 10 MG tablet Take 10 mg by mouth daily. 10/09/20  Yes [provider]  predniSONE (DELTASONE) 20 MG tablet Take 2 tablets (40 mg total) by mouth daily for 5 days. 12/08/20 12/13/20 Yes Odis Luster, FNP  meloxicam (MOBIC) 7.5 MG tablet Take 1 tablet (7.5 mg total) by mouth daily. 02/26/19   Tasia Catchings, Amy V, PA-C  methocarbamol (ROBAXIN) 500 MG tablet Take 1 tablet (500 mg total) by mouth 2 (two) times daily. 02/26/19   Tasia Catchings, Amy V, PA-C  naproxen (NAPROSYN) 500 MG tablet Take 1 tablet (500 mg total) by mouth 2 (two) times daily as needed. 10/30/20   Lucrezia Starch, MD  tamsulosin (FLOMAX) 0.4 MG CAPS capsule Take 1 capsule (0.4 mg total) by mouth daily. 10/30/20   Lucrezia Starch, MD    Family History Family History   Problem Relation Age of Onset   Prostate cancer Father     Social History Social History   Tobacco Use   Smoking status: Never   Smokeless tobacco: Never  Substance Use Topics   Alcohol use: Not Currently    Comment: socially   Drug use: Never     Allergies   Patient has no known allergies.   Review of Systems Review of Systems Per HPI  Physical Exam Triage Vital Signs ED Triage Vitals  Enc Vitals Group     BP 12/08/20 1745 (!) 144/81     Pulse Rate 12/08/20 1745 83     Resp 12/08/20 1745 18     Temp 12/08/20 1745 98.1 F (36.7 C)     Temp Source 12/08/20 1745 Oral     SpO2 12/08/20 1745 95 %     Weight 12/08/20 1747 240 lb (108.9 kg)     Height 12/08/20 1747 5\' 10"  (1.778 m)     Head Circumference --      Peak Flow --      Pain Score 12/08/20 1747 0     Pain Loc --      Pain Edu? --      Excl. in [place]? --    No data found.  Updated Vital Signs BP (!) 144/81 (BP Location: Left Arm)   Pulse 83  Temp 98.1 F (36.7 C) (Oral)   Resp 18   Ht 5\' 10"  (1.778 m)   Wt 240 lb (108.9 kg)   SpO2 95%   BMI 34.44 kg/m   Visual Acuity Right Eye Distance:   Left Eye Distance:   Bilateral Distance:    Right Eye Near:   Left Eye Near:    Bilateral Near:     Physical Exam Constitutional:      General: He is not in acute distress.    Appearance: Normal appearance. He is not toxic-appearing or diaphoretic.  HENT:     Head: Normocephalic and atraumatic.     Right Ear: Tympanic membrane and ear canal normal.     Left Ear: Tympanic membrane and ear canal normal.     Nose: Congestion present.     Mouth/Throat:     Mouth: Mucous membranes are moist.     Pharynx: Posterior oropharyngeal erythema present.  Eyes:     Extraocular Movements: Extraocular movements intact.     Conjunctiva/sclera: Conjunctivae normal.     Pupils: Pupils are equal, round, and reactive to light.  Cardiovascular:     Rate and Rhythm: Normal rate and regular rhythm.     Pulses: Normal  pulses.     Heart sounds: Normal heart sounds.  Pulmonary:     Effort: Pulmonary effort is normal. No respiratory distress.     Breath sounds: Wheezing present.  Abdominal:     General: Abdomen is flat. Bowel sounds are normal.     Palpations: Abdomen is soft.  Musculoskeletal:        General: Normal range of motion.     Cervical back: Normal range of motion.  Skin:    General: Skin is warm and dry.  Neurological:     General: No focal deficit present.     Mental Status: He is alert and oriented to person, place, and time. Mental status is at baseline.  Psychiatric:        Mood and Affect: Mood normal.        Behavior: Behavior normal.     UC Treatments / Results  Labs (all labs ordered are listed, but only abnormal results are displayed) Labs Reviewed  CULTURE, GROUP A STREP ([place])  NOVEL CORONAVIRUS, NAA  POCT RAPID STREP A (OFFICE)    EKG   Radiology No results found.  Procedures Procedures (including critical care time)  Medications Ordered in UC Medications - No data to display  Initial Impression / Assessment and Plan / UC Course  I have reviewed the triage vital signs and the nursing notes.  Pertinent labs & imaging results that were available during my care of the patient were reviewed by me and considered in my medical decision making (see chart for details).     Patient presents with symptoms likely from a viral upper respiratory infection. Differential includes bacterial pneumonia, sinusitis, allergic rhinitis, Covid 19. Do not suspect underlying cardiopulmonary process. Symptoms seem unlikely related to ACS, CHF or COPD exacerbations, pneumonia, pneumothorax. Patient is nontoxic appearing and not in need of emergent medical intervention.  Rapid strep test was negative.  Throat culture and COVID-19 viral swab are pending.  Will treat with prednisone steroid and albuterol inhaler to help alleviate inflammation and wheezing.  Advised patient to avoid  NSAIDs while taking prednisone.  Do not think chest imaging is necessary at this time given patient's clinical symptoms.  Recommended symptom control with over the counter medications: Daily oral anti-histamine, Oral decongestant or  IN corticosteroid, saline irrigations, cepacol lozenges, Robitussin, Delsym, honey tea.  Return if symptoms fail to improve in 1-2 weeks or you develop shortness of breath, chest pain, severe headache. Patient states understanding and is agreeable.  Discharged with PCP followup.  Final Clinical Impressions(s) / UC Diagnoses   Final diagnoses:  Viral upper respiratory tract infection with cough  Wheezing  Sore throat  Encounter for laboratory testing for COVID-19 virus     Discharge Instructions      You likely having a viral upper respiratory infection. We recommended symptom control. I expect your symptoms to start improving in the next 1-2 weeks.   1. Take a daily allergy pill/anti-histamine like Zyrtec, Claritin, or Store brand consistently for 2 weeks  2. For congestion you may try an oral decongestant like Mucinex or sudafed. You may also try intranasal flonase nasal spray or saline irrigations (neti pot, sinus cleanse)  3. For your sore throat you may try cepacol lozenges, salt water gargles, throat spray. Treatment of congestion may also help your sore throat.  4. For cough you may try Robitussen, Mucinex DM  5. Take Tylenol or Ibuprofen to help with pain/inflammation  6. Stay hydrated, drink plenty of fluids to keep throat coated and less irritated  Honey Tea For cough/sore throat try using a honey-based tea. Use 3 teaspoons of honey with juice squeezed from half lemon. Place shaved pieces of ginger into 1/2-1 cup of water and warm over stove top. Then mix the ingredients and repeat every 4 hours as needed.   Your rapid strep test was negative.  Throat culture and COVID-19 viral swab are pending.  We will call if they are positive.  You have  been prescribed prednisone steroid and albuterol inhaler.  Please take albuterol inhaler as soon as you receive it from the pharmacy and then as needed.  Please avoid taking any ibuprofen, naproxen, meloxicam if able while taking prednisone.     ED Prescriptions     Medication Sig Dispense Auth. Provider   predniSONE (DELTASONE) 20 MG tablet Take 2 tablets (40 mg total) by mouth daily for 5 days. 10 tablet Odis Luster, FNP   albuterol (VENTOLIN HFA) 108 (90 Base) MCG/ACT inhaler Inhale 1-2 puffs into the lungs every 6 (six) hours as needed for wheezing or shortness of breath. 1 each Odis Luster, FNP      PDMP not reviewed this encounter.   Odis Luster, FNP 12/08/20 1816

## 2020-12-09 LAB — SARS-COV-2, NAA 2 DAY TAT

## 2020-12-09 LAB — NOVEL CORONAVIRUS, NAA: SARS-CoV-2, NAA: NOT DETECTED

## 2020-12-12 LAB — CULTURE, GROUP A STREP (THRC)

## 2020-12-22 ENCOUNTER — Ambulatory Visit
Admission: EM | Admit: 2020-12-22 | Discharge: 2020-12-22 | Disposition: A | Discharge status: Home / Self Care | Payer: BC Managed Care – PPO | Attending: Internal Medicine | Admitting: Internal Medicine

## 2020-12-22 ENCOUNTER — Ambulatory Visit (INDEPENDENT_AMBULATORY_CARE_PROVIDER_SITE_OTHER): Payer: BC Managed Care – PPO

## 2020-12-22 ENCOUNTER — Encounter: Payer: Self-pay | Admitting: Emergency Medicine

## 2020-12-22 ENCOUNTER — Other Ambulatory Visit: Payer: Self-pay

## 2020-12-22 DIAGNOSIS — R053 Chronic cough: Secondary | ICD-10-CM | POA: Diagnosis not present

## 2020-12-22 DIAGNOSIS — R0602 Shortness of breath: Secondary | ICD-10-CM

## 2020-12-22 DIAGNOSIS — J189 Pneumonia, unspecified organism: Secondary | ICD-10-CM

## 2020-12-22 DIAGNOSIS — R062 Wheezing: Secondary | ICD-10-CM

## 2020-12-22 DIAGNOSIS — R059 Cough, unspecified: Secondary | ICD-10-CM

## 2020-12-22 MED ORDER — AMOXICILLIN 500 MG PO CAPS: 1000.0000 mg | ORAL_CAPSULE | Freq: Three times a day (TID) | ORAL | 0 refills | Status: AC

## 2020-12-22 MED ORDER — AZITHROMYCIN 500 MG PO TABS: ORAL_TABLET | ORAL | 0 refills | Status: AC

## 2020-12-22 NOTE — ED Triage Notes (Signed)
Patient c/o productive cough, wheezing, bloody noses, using the inhaler, finished meds, still feel bad.  Patient is vaccinated for COVID.

## 2020-12-22 NOTE — Discharge Instructions (Addendum)
Your x-ray is showing signs of pneumonia.  You have been prescribed 2 antibiotics to treat this.  Please go the hospital if symptoms do not improve or if they worsen.

## 2020-12-22 NOTE — ED Provider Notes (Signed)
[name]    [name]: [id number] Arrival date & time: 12/22/20  1734      History   Chief Complaint Chief Complaint  Patient presents with   Cough    HPI Derek Fitzgerald is a 40 y.o. male.   Patient presents with productive cough, wheezing, shortness of breath that has been present for multiple weeks.  Patient was seen on 12/08/2020 with similar symptoms and was prescribed prednisone and albuterol inhaler.  Patient reports no improvement in symptoms.  Cough is productive with yellow sputum.  Denies any fevers.  Patient has also been taking Mucinex with no improvement in symptoms.  Also endorses nasal congestion has been present for a few days.  Denies any chest pain.  Patient's COVID-19 test at previous visit was negative.   Cough  Past Medical History:  Diagnosis Date   Fatty liver    Hypercholesteremia     There are no problems to display for this patient.   History reviewed. No pertinent surgical history.     Home Medications    Prior to Admission medications   Medication Sig Start Date End Date Taking? Authorizing Provider  albuterol (VENTOLIN HFA) 108 (90 Base) MCG/ACT inhaler Inhale 1-2 puffs into the lungs every 6 (six) hours as needed for wheezing or shortness of breath. 12/08/20  Yes Emersyn Wyss, Hildred Alamin E, FNP  amoxicillin (AMOXIL) 500 MG capsule Take 2 capsules (1,000 mg total) by mouth 3 (three) times daily for 5 days. 12/22/20 12/27/20 Yes Keilly Fatula, Michele Rockers, FNP  azithromycin (ZITHROMAX) 500 MG tablet Take 1 tablet (500 mg total) by mouth daily for 1 day, THEN 0.5 tablets (250 mg total) daily for 4 days. 12/22/20 12/27/20 Yes Adit Riddles, Michele Rockers, FNP  escitalopram (LEXAPRO) 10 MG tablet Take 10 mg by mouth daily. 10/09/20  Yes [provider]  meloxicam (MOBIC) 7.5 MG tablet Take 1 tablet (7.5 mg total) by mouth daily. 02/26/19  Yes Yu, Amy V, PA-C  methocarbamol (ROBAXIN) 500 MG tablet Take 1 tablet (500 mg total) by mouth 2 (two) times daily. 02/26/19   Yes Yu, Amy V, PA-C  naproxen (NAPROSYN) 500 MG tablet Take 1 tablet (500 mg total) by mouth 2 (two) times daily as needed. 10/30/20  Yes Lucrezia Starch, MD  tamsulosin (FLOMAX) 0.4 MG CAPS capsule Take 1 capsule (0.4 mg total) by mouth daily. 10/30/20  Yes Dykstra, Ellwood Dense, MD    Family History Family History  Problem Relation Age of Onset   Prostate cancer Father     Social History Social History   Tobacco Use   Smoking status: Never   Smokeless tobacco: Never  Substance Use Topics   Alcohol use: Not Currently    Comment: socially   Drug use: Never     Allergies   Patient has no known allergies.   Review of Systems Review of Systems Per HPI  Physical Exam Triage Vital Signs ED Triage Vitals  Enc Vitals Group     BP 12/22/20 1947 (!) 146/79     Pulse Rate 12/22/20 1947 82     Resp 12/22/20 1947 20     Temp 12/22/20 1947 98.5 F (36.9 C)     Temp Source 12/22/20 1947 Oral     SpO2 12/22/20 1947 94 %     Weight --      Height --      Head Circumference --      Peak Flow --      Pain Score 12/22/20 1949  0     Pain Loc --      Pain Edu? --      Excl. in [place]? --    No data found.  Updated Vital Signs BP (!) 146/79 (BP Location: Left Arm)   Pulse 82   Temp 98.5 F (36.9 C) (Oral)   Resp 20   SpO2 94%   Visual Acuity Right Eye Distance:   Left Eye Distance:   Bilateral Distance:    Right Eye Near:   Left Eye Near:    Bilateral Near:     Physical Exam Constitutional:      General: He is not in acute distress.    Appearance: Normal appearance. He is not toxic-appearing or diaphoretic.  HENT:     Head: Normocephalic and atraumatic.     Right Ear: Tympanic membrane and ear canal normal.     Left Ear: Tympanic membrane and ear canal normal.     Nose: Congestion present.     Mouth/Throat:     Mouth: Mucous membranes are moist.     Pharynx: No posterior oropharyngeal erythema.  Eyes:     Extraocular Movements: Extraocular movements intact.      Conjunctiva/sclera: Conjunctivae normal.     Pupils: Pupils are equal, round, and reactive to light.  Cardiovascular:     Rate and Rhythm: Normal rate and regular rhythm.     Pulses: Normal pulses.     Heart sounds: Normal heart sounds.  Pulmonary:     Effort: Pulmonary effort is normal. No respiratory distress.     Breath sounds: No stridor. Wheezing and rhonchi present. No rales.  Abdominal:     General: Abdomen is flat. Bowel sounds are normal.     Palpations: Abdomen is soft.  Musculoskeletal:        General: Normal range of motion.     Cervical back: Normal range of motion.  Skin:    General: Skin is warm and dry.  Neurological:     General: No focal deficit present.     Mental Status: He is alert and oriented to person, place, and time. Mental status is at baseline.  Psychiatric:        Mood and Affect: Mood normal.        Behavior: Behavior normal.     UC Treatments / Results  Labs (all labs ordered are listed, but only abnormal results are displayed) Labs Reviewed - No data to display  EKG   Radiology DG Chest 2 View  Result Date: 12/22/2020 CLINICAL DATA:  Cough, shortness of breath, wheezing, epistaxis EXAM: CHEST - 2 VIEW COMPARISON:  03/17/2004 FINDINGS: Normal heart size, mediastinal contours, and pulmonary vascularity. Minimal hazy opacity adjacent to LEFT heart border, question minimal lingular infiltrate, new. Remaining lungs clear. No pleural effusion or pneumothorax. No pleural effusion or pneumothorax. Bones unremarkable. IMPRESSION: Question minimal lingular infiltrate. Electronically Signed   By: Lavonia Dana M.D.   On: 12/22/2020 20:33    Procedures Procedures (including critical care time)  Medications Ordered in UC Medications - No data to display  Initial Impression / Assessment and Plan / UC Course  I have reviewed the triage vital signs and the nursing notes.  Pertinent labs & imaging results that were available during my care of the patient  were reviewed by me and considered in my medical decision making (see chart for details).     Chest x-ray suggestive of community-acquired pneumonia showing left lower lingular infiltrate.  Will treat with azithromycin and  amoxicillin.  Patient may continue albuterol inhaler.  Advised patient to go to the hospital if symptoms significantly worsen.  No red flags seen on exam and no signs of respiratory distress at this time.Discussed strict return precautions. Patient verbalized understanding and is agreeable with plan.  Final Clinical Impressions(s) / UC Diagnoses   Final diagnoses:  Lingular pneumonia  Persistent cough     Discharge Instructions      Your x-ray is showing signs of pneumonia.  You have been prescribed 2 antibiotics to treat this.  Please go the hospital if symptoms do not improve or if they worsen.     ED Prescriptions     Medication Sig Dispense Auth. Provider   amoxicillin (AMOXIL) 500 MG capsule Take 2 capsules (1,000 mg total) by mouth 3 (three) times daily for 5 days. 30 capsule [place], [place] E, [place]   azithromycin (ZITHROMAX) 500 MG tablet Take 1 tablet (500 mg total) by mouth daily for 1 day, THEN 0.5 tablets (250 mg total) daily for 4 days. 3 tablet [place], Michele Rockers, [place]      PDMP not reviewed this encounter.   Teodora Medici, [place] 12/22/20 2100

## 2020-12-24 ENCOUNTER — Encounter (HOSPITAL_COMMUNITY): Payer: Self-pay

## 2020-12-24 ENCOUNTER — Other Ambulatory Visit: Payer: Self-pay

## 2020-12-24 ENCOUNTER — Emergency Department (HOSPITAL_COMMUNITY): Payer: BC Managed Care – PPO

## 2020-12-24 ENCOUNTER — Emergency Department (HOSPITAL_COMMUNITY)
Admission: EM | Admit: 2020-12-24 | Discharge: 2020-12-24 | Disposition: A | Discharge status: Home / Self Care | Payer: BC Managed Care – PPO | Attending: Student | Admitting: Student

## 2020-12-24 DIAGNOSIS — N200 Calculus of kidney: Secondary | ICD-10-CM | POA: Insufficient documentation

## 2020-12-24 DIAGNOSIS — R109 Unspecified abdominal pain: Secondary | ICD-10-CM | POA: Diagnosis present

## 2020-12-24 DIAGNOSIS — R062 Wheezing: Secondary | ICD-10-CM | POA: Insufficient documentation

## 2020-12-24 DIAGNOSIS — J189 Pneumonia, unspecified organism: Secondary | ICD-10-CM

## 2020-12-24 LAB — CBC
HCT: 48.4 % (ref 39.0–52.0)
Hemoglobin: 16.3 g/dL (ref 13.0–17.0)
MCH: 30.1 pg (ref 26.0–34.0)
MCHC: 33.7 g/dL (ref 30.0–36.0)
MCV: 89.5 fL (ref 80.0–100.0)
Platelets: UNDETERMINED 10*3/uL (ref 150–400)
RBC: 5.41 MIL/uL (ref 4.22–5.81)
RDW: 12.8 % (ref 11.5–15.5)
WBC: 7.8 10*3/uL (ref 4.0–10.5)
nRBC: 0 % (ref 0.0–0.2)

## 2020-12-24 LAB — URINALYSIS, ROUTINE W REFLEX MICROSCOPIC
Bilirubin Urine: NEGATIVE
Glucose, UA: NEGATIVE mg/dL
Ketones, ur: NEGATIVE mg/dL
Leukocytes,Ua: NEGATIVE
Nitrite: NEGATIVE
Protein, ur: NEGATIVE mg/dL
Specific Gravity, Urine: 1.018 (ref 1.005–1.030)
pH: 6 (ref 5.0–8.0)

## 2020-12-24 LAB — BASIC METABOLIC PANEL
Anion gap: 9 (ref 5–15)
BUN: 11 mg/dL (ref 6–20)
CO2: 22 mmol/L (ref 22–32)
Calcium: 9.1 mg/dL (ref 8.9–10.3)
Chloride: 108 mmol/L (ref 98–111)
Creatinine, Ser: 1 mg/dL (ref 0.61–1.24)
GFR, Estimated: >60 mL/min (ref >60)
Glucose, Bld: 106 mg/dL — ABNORMAL HIGH (ref 70–99)
Potassium: 4 mmol/L (ref 3.5–5.1)
Sodium: 139 mmol/L (ref 135–145)

## 2020-12-24 MED ORDER — TAMSULOSIN HCL 0.4 MG PO CAPS: 0.4000 mg | ORAL_CAPSULE | Freq: Every day | ORAL | 0 refills | Status: AC

## 2020-12-24 NOTE — ED Triage Notes (Signed)
Pt states that he woke up with right sided flank pain and feels that he may have another kidney stone. Pt was diagnosed with PNA yesterday, placed on 2 L via [place] due to oxygen saturations being 88-89%.

## 2020-12-24 NOTE — ED Provider Notes (Signed)
[facility] DEPT Provider Note   [name]: [id number] Arrival date & time: 12/24/20  8338     History Chief Complaint  Patient presents with   Nephrolithiasis    Derek Fitzgerald is a 40 y.o. male who presents emergency department for evaluation of right flank pain.  Patient states he has a history of nephrolithiasis but is usually on the left.  States that today around 3 AM he had acute onset right flank pain that radiated down into the groin.  Patient was recently diagnosed with lingular pneumonia and is currently on amoxicillin.  Denies fever, chest pain, abdominal pain, nausea, vomiting, dysuria or other systemic symptoms.  He does have a cough associated with his current pneumonia.  While in the lobby his initial triage vitals showed mild hypoxia to 88% and he was placed on 2 L nasal cannula in the lobby.  HPI     Past Medical History:  Diagnosis Date   Fatty liver    Hypercholesteremia     There are no problems to display for this patient.   History reviewed. No pertinent surgical history.     Family History  Problem Relation Age of Onset   Prostate cancer Father     Social History   Tobacco Use   Smoking status: Never   Smokeless tobacco: Never  Substance Use Topics   Alcohol use: Not Currently    Comment: socially   Drug use: Never    Home Medications Prior to Admission medications   Medication Sig Start Date End Date Taking? Authorizing Provider  albuterol (VENTOLIN HFA) 108 (90 Base) MCG/ACT inhaler Inhale 1-2 puffs into the lungs every 6 (six) hours as needed for wheezing or shortness of breath. 12/08/20   Teodora Medici, FNP  amoxicillin (AMOXIL) 500 MG capsule Take 2 capsules (1,000 mg total) by mouth 3 (three) times daily for 5 days. 12/22/20 12/27/20  Teodora Medici, FNP  azithromycin (ZITHROMAX) 500 MG tablet Take 1 tablet (500 mg total) by mouth daily for 1 day, THEN 0.5 tablets (250 mg total) daily for 4 days. 12/22/20  12/27/20  Teodora Medici, FNP  escitalopram (LEXAPRO) 10 MG tablet Take 10 mg by mouth daily. 10/09/20   [provider]  meloxicam (MOBIC) 7.5 MG tablet Take 1 tablet (7.5 mg total) by mouth daily. 02/26/19   Tasia Catchings, Amy V, PA-C  methocarbamol (ROBAXIN) 500 MG tablet Take 1 tablet (500 mg total) by mouth 2 (two) times daily. 02/26/19   Tasia Catchings, Amy V, PA-C  naproxen (NAPROSYN) 500 MG tablet Take 1 tablet (500 mg total) by mouth 2 (two) times daily as needed. 10/30/20   Lucrezia Starch, MD  tamsulosin (FLOMAX) 0.4 MG CAPS capsule Take 1 capsule (0.4 mg total) by mouth daily. 10/30/20   Lucrezia Starch, MD    Allergies    Patient has no known allergies.  Review of Systems   Review of Systems  Constitutional:  Negative for chills and fever.  HENT:  Negative for ear pain and sore throat.   Eyes:  Negative for pain and visual disturbance.  Respiratory:  Positive for cough. Negative for shortness of breath.   Cardiovascular:  Negative for chest pain and palpitations.  Gastrointestinal:  Negative for abdominal pain and vomiting.  Genitourinary:  Positive for flank pain. Negative for dysuria and hematuria.  Musculoskeletal:  Negative for arthralgias and back pain.  Skin:  Negative for color change and rash.  Neurological:  Negative for seizures and syncope.  All other systems reviewed and are negative.  Physical Exam Updated Vital Signs BP (!) 157/94   Pulse 67   Temp 98.1 F (36.7 C) (Oral)   Resp 16   Ht 5\' 9"  (1.753 m)   Wt 106.6 kg   SpO2 99%   BMI 34.70 kg/m   Physical Exam Vitals and nursing note reviewed.  Constitutional:      Appearance: He is well-developed.  HENT:     Head: Normocephalic and atraumatic.  Eyes:     Conjunctiva/sclera: Conjunctivae normal.  Cardiovascular:     Rate and Rhythm: Normal rate and regular rhythm.     Heart sounds: No murmur heard. Pulmonary:     Effort: Pulmonary effort is normal. No respiratory distress.     Breath sounds: Wheezing  (Left-sided with cough only) present.  Abdominal:     Palpations: Abdomen is soft.     Tenderness: There is no abdominal tenderness. There is right CVA tenderness (mild).  Musculoskeletal:     Cervical back: Neck supple.  Skin:    General: Skin is warm and dry.  Neurological:     Mental Status: He is alert.    ED Results / Procedures / Treatments   Labs (all labs ordered are listed, but only abnormal results are displayed) Labs Reviewed  BASIC METABOLIC PANEL - Abnormal; Notable for the following components:      Result Value   Glucose, Bld 106 (*)    All other components within normal limits  CBC  URINALYSIS, ROUTINE W REFLEX MICROSCOPIC    EKG None  Radiology DG Chest 2 View  Result Date: 12/22/2020 CLINICAL DATA:  Cough, shortness of breath, wheezing, epistaxis EXAM: CHEST - 2 VIEW COMPARISON:  03/17/2004 FINDINGS: Normal heart size, mediastinal contours, and pulmonary vascularity. Minimal hazy opacity adjacent to LEFT heart border, question minimal lingular infiltrate, new. Remaining lungs clear. No pleural effusion or pneumothorax. No pleural effusion or pneumothorax. Bones unremarkable. IMPRESSION: Question minimal lingular infiltrate. Electronically Signed   By: Lavonia Dana M.D.   On: 12/22/2020 20:33   CT Renal Stone Study  Result Date: 12/24/2020 CLINICAL DATA:  40 year old male with history of right-sided flank pain radiating into the groin. Suspected kidney stone. EXAM: CT ABDOMEN AND PELVIS WITHOUT CONTRAST TECHNIQUE: Multidetector CT imaging of the abdomen and pelvis was performed following the standard protocol without IV contrast. COMPARISON:  CT the abdomen and pelvis 10/30/2020. FINDINGS: Lower chest: Unremarkable. Hepatobiliary: Diffuse low attenuation throughout the hepatic parenchyma, indicative of hepatic steatosis. No discrete cystic or solid hepatic lesions are confidently identified on today's noncontrast CT examination. Unenhanced appearance of the  gallbladder is normal. Pancreas: No pancreatic mass. No pancreatic ductal dilatation. No pancreatic or peripancreatic fluid collections or inflammatory changes. Spleen: Unremarkable. Adrenals/Urinary Tract: In the distal third of the right ureter (axial image 67 of series 2) there is a 2 mm calculus just proximal to the right ureterovesicular junction. This is not associated with significant proximal right hydroureteronephrosis to indicate obstruction at this time. Additional nonobstructive calculi are noted within the lower pole collecting system of the left kidney measuring up to 8 mm in length. No additional calculi are noted along the course of the left ureter or within the lumen of the urinary bladder. No hydroureteronephrosis. Unenhanced appearance of the kidneys, bilateral adrenal glands and urinary bladder is otherwise normal. Stomach/Bowel: Unenhanced appearance of the stomach is normal. There is no pathologic dilatation of small bowel or colon. Normal appendix. Vascular/Lymphatic: Atherosclerotic calcifications in the pelvic  vasculature. No lymphadenopathy noted in the abdomen or pelvis. Reproductive: Prostate gland and seminal vesicles are unremarkable in appearance. Other: No significant volume of ascites.  No pneumoperitoneum. Musculoskeletal: There are no aggressive appearing lytic or blastic lesions noted in the visualized portions of the skeleton. IMPRESSION: 1. 2 mm nonobstructive calculus in the distal third of the right ureter shortly before the right ureterovesicular junction. No proximal hydroureteronephrosis is noted at this time. 2. Additional nonobstructive calculi are noted within the left renal collecting system measuring up to 8 mm in length. 3. Hepatic steatosis. 4. 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 Electronically Signed   By: Vinnie Langton M.D.   On: 12/24/2020 05:23    Procedures Procedures   Medications Ordered in ED Medications - No data to display  ED Course  I  have reviewed the triage vital signs and the nursing notes.  Pertinent labs & imaging results that were available during my care of the patient were reviewed by me and considered in my medical decision making (see chart for details).    MDM Rules/Calculators/A&P                           Patient seen emergency department for evaluation of right flank pain and concern for nephrolithiasis.  Physical exam with left-sided wheezing on coughing consistent with his pneumonia, mild right-sided CVA tenderness.  Laboratory evaluation unremarkable including a normal creatinine.  CT stone study with a 2 mm right-sided stone just proximal to the UVJ.  On my evaluation, the patient's pain has improved with no therapy and I suspect the patient is close to passing the stone.  He is denying need for pain medication on my evaluation.  Patient was taken off of his 2 L nasal cannula and observed for approximately 30 minutes with no evidence of desaturation while at rest or with exertion.  Urinalysis with no leuk esterase or nitrites, few bacteria and I have minimal concern for infected stone at this time.  On reevaluation, patient continues to not requiring oxygen the patient was discharged with a prescription for Flomax and urology follow-up. Final Clinical Impression(s) / ED Diagnoses Final diagnoses:  None    Rx / DC Orders ED Discharge Orders     None        Allante Whitmire, MD 12/24/20 1012

## 2020-12-24 NOTE — Discharge Instructions (Signed)
You were seen in the emergency department for evaluation of flank pain.  Your CAT scan shows a small kidney stone on the right that is close to passing.  I will prescribe a medication called Flomax to assist in passing the stone.  We should follow-up with a urologist as this is your second kidney stone and the numbers provided above.  I will call you with results of your urinalysis, and if this is positive for infection, we may need to switch up your antibiotics which I can call in over the phone.  At this time you are safe for discharge, please return to the emergency department if you have new or worsening shortness of breath, fever, vomiting, inability urinate or any other concerning symptoms.

## 2021-01-31 ENCOUNTER — Other Ambulatory Visit: Payer: Self-pay

## 2021-01-31 ENCOUNTER — Ambulatory Visit (INDEPENDENT_AMBULATORY_CARE_PROVIDER_SITE_OTHER): Payer: BC Managed Care – PPO

## 2021-01-31 ENCOUNTER — Ambulatory Visit
Admission: EM | Admit: 2021-01-31 | Discharge: 2021-01-31 | Disposition: A | Discharge status: Home / Self Care | Payer: BC Managed Care – PPO | Attending: Physician Assistant | Admitting: Physician Assistant

## 2021-01-31 DIAGNOSIS — J189 Pneumonia, unspecified organism: Secondary | ICD-10-CM | POA: Diagnosis not present

## 2021-01-31 DIAGNOSIS — R059 Cough, unspecified: Secondary | ICD-10-CM

## 2021-01-31 MED ORDER — LEVOFLOXACIN 500 MG PO TABS: 500.0000 mg | ORAL_TABLET | Freq: Every day | ORAL | 0 refills | Status: DC

## 2021-01-31 NOTE — ED Triage Notes (Signed)
Pt c/o coughing with blood, fatigue, wheezing, chest pain when coughing, nasal congestion starting on 01/30/21. Pt took an old inhaler.  Blood was fluid and red when coughing.

## 2021-02-01 ENCOUNTER — Encounter: Payer: Self-pay | Admitting: Physician Assistant

## 2021-02-01 NOTE — ED Provider Notes (Signed)
[facility]    [name]: [id number] Arrival date & time: 01/31/21  1724      History   Chief Complaint Chief Complaint  Patient presents with   Cough    HPI Derek Fitzgerald is a 40 y.o. male.   Patient here today for evaluation of continued productive cough. He was diagnosed with pneumonia over a month ago. He reports he has not had fever. At times cough is productive of blood tinged sputum.   The history is provided by the patient.  Cough Associated symptoms: wheezing   Associated symptoms: no chills, no eye discharge and no fever    Past Medical History:  Diagnosis Date   Fatty liver    Hypercholesteremia     There are no problems to display for this patient.   History reviewed. No pertinent surgical history.     Home Medications    Prior to Admission medications   Medication Sig Start Date End Date Taking? Authorizing Provider  albuterol (VENTOLIN HFA) 108 (90 Base) MCG/ACT inhaler Inhale 1-2 puffs into the lungs every 6 (six) hours as needed for wheezing or shortness of breath. 12/08/20  Yes [place], Hildred Alamin E, FNP  escitalopram (LEXAPRO) 10 MG tablet Take 10 mg by mouth daily. 10/09/20  Yes [provider]  levofloxacin (LEVAQUIN) 500 MG tablet Take 1 tablet (500 mg total) by mouth daily. 01/31/21  Yes Francene Finders, PA-C  meloxicam (MOBIC) 7.5 MG tablet Take 1 tablet (7.5 mg total) by mouth daily. 02/26/19  Yes Yu, Amy V, PA-C  methocarbamol (ROBAXIN) 500 MG tablet Take 1 tablet (500 mg total) by mouth 2 (two) times daily. 02/26/19   Tasia Catchings, Amy V, PA-C  naproxen (NAPROSYN) 500 MG tablet Take 1 tablet (500 mg total) by mouth 2 (two) times daily as needed. 10/30/20   Lucrezia Starch, MD    Family History Family History  Problem Relation Age of Onset   Prostate cancer Father     Social History Social History   Tobacco Use   Smoking status: Never   Smokeless tobacco: Never  Vaping Use   Vaping Use: Never used  Substance Use Topics    Alcohol use: Yes    Comment: socially   Drug use: Never     Allergies   Patient has no known allergies.   Review of Systems Review of Systems  Constitutional:  Negative for chills and fever.  HENT:  Positive for congestion.   Eyes:  Negative for discharge and redness.  Respiratory:  Positive for cough and wheezing.   Gastrointestinal:  Negative for nausea and vomiting.    Physical Exam Triage Vital Signs ED Triage Vitals  Enc Vitals Group     BP 01/31/21 1824 (!) 158/110     Pulse Rate 01/31/21 1824 91     Resp 01/31/21 1824 18     Temp 01/31/21 1824 98.4 F (36.9 C)     Temp Source 01/31/21 1824 Oral     SpO2 01/31/21 1824 95 %     Weight 01/31/21 1822 230 lb (104.3 kg)     Height 01/31/21 1822 5\' 10"  (1.778 m)     Head Circumference --      Peak Flow --      Pain Score 01/31/21 1821 3     Pain Loc --      Pain Edu? --      Excl. in [place]? --    No data found.  Updated Vital Signs BP Marland Kitchen)  158/110 (BP Location: Left Arm)   Pulse 91   Temp 98.4 F (36.9 C) (Oral)   Resp 18   Ht 5\' 10"  (1.778 m)   Wt 230 lb (104.3 kg)   SpO2 95%   BMI 33.00 kg/m      Physical Exam Vitals and nursing note reviewed.  Constitutional:      General: He is not in acute distress.    Appearance: Normal appearance. He is not ill-appearing.  HENT:     Head: Normocephalic and atraumatic.     Nose: Nose normal. No congestion.     Mouth/Throat:     Mouth: Mucous membranes are moist.     Pharynx: Oropharynx is clear. No oropharyngeal exudate or posterior oropharyngeal erythema.  Eyes:     Conjunctiva/sclera: Conjunctivae normal.  Cardiovascular:     Rate and Rhythm: Normal rate and regular rhythm.     Heart sounds: Normal heart sounds. No murmur heard. Pulmonary:     Effort: Pulmonary effort is normal. No respiratory distress.     Breath sounds: Normal breath sounds. No wheezing, rhonchi or rales.  Skin:    General: Skin is warm and dry.  Neurological:     Mental Status: He is  alert.  Psychiatric:        Mood and Affect: Mood normal.        Thought Content: Thought content normal.     UC Treatments / Results  Labs (all labs ordered are listed, but only abnormal results are displayed) Labs Reviewed - No data to display  EKG   Radiology DG Chest 2 View  Result Date: 01/31/2021 CLINICAL DATA:  Cough EXAM: CHEST - 2 VIEW COMPARISON:  12/22/2020, 08/14/2013, 03/17/2004 FINDINGS: There is patchy opacity at the lingula. No pleural effusion. Normal cardiomediastinal silhouette. No pneumothorax. IMPRESSION: Patchy lingular opacity, persistent and maybe a little more prominent when compared with October 2022, this is indeterminate for pneumonia or other airspace opacity. Chest CT follow-up may be performed as indicated. Electronically Signed   By: Donavan Foil M.D.   On: 01/31/2021 18:54    Procedures Procedures (including critical care time)  Medications Ordered in UC Medications - No data to display  Initial Impression / Assessment and Plan / UC Course  I have reviewed the triage vital signs and the nursing notes.  Pertinent labs & imaging results that were available during my care of the patient were reviewed by me and considered in my medical decision making (see chart for details).    Xray with continued, if not progressed, opacity to lingular area of prior diagnosed pneumonia. Recommended follow up with PCP- radiologist suggesting CT. I will trial levaquin in hopes for improvement in the meantime.    Final Clinical Impressions(s) / UC Diagnoses   Final diagnoses:  Pneumonia due to infectious organism, unspecified laterality, unspecified part of lung   Discharge Instructions   None    ED Prescriptions     Medication Sig Dispense Auth. Provider   levofloxacin (LEVAQUIN) 500 MG tablet Take 1 tablet (500 mg total) by mouth daily. 7 tablet Francene Finders, PA-C      PDMP not reviewed this encounter.   Francene Finders, PA-C 02/01/21 [phone]

## 2021-02-02 ENCOUNTER — Other Ambulatory Visit: Payer: Self-pay

## 2021-02-02 ENCOUNTER — Emergency Department (HOSPITAL_COMMUNITY): Payer: BC Managed Care – PPO

## 2021-02-02 ENCOUNTER — Emergency Department (HOSPITAL_COMMUNITY)
Admission: EM | Admit: 2021-02-02 | Discharge: 2021-02-03 | Disposition: A | Discharge status: Home / Self Care | Payer: BC Managed Care – PPO | Attending: Emergency Medicine | Admitting: Emergency Medicine

## 2021-02-02 ENCOUNTER — Encounter (HOSPITAL_COMMUNITY): Payer: Self-pay | Admitting: Oncology

## 2021-02-02 DIAGNOSIS — R059 Cough, unspecified: Secondary | ICD-10-CM

## 2021-02-02 DIAGNOSIS — U071 COVID-19: Secondary | ICD-10-CM | POA: Diagnosis not present

## 2021-02-02 LAB — CBC WITH DIFFERENTIAL/PLATELET
Abs Immature Granulocytes: 0.02 10*3/uL (ref 0.00–0.07)
Basophils Absolute: 0.1 10*3/uL (ref 0.0–0.1)
Basophils Relative: 1 %
Eosinophils Absolute: 0.4 10*3/uL (ref 0.0–0.5)
Eosinophils Relative: 7 %
HCT: 50.1 % (ref 39.0–52.0)
Hemoglobin: 16.9 g/dL (ref 13.0–17.0)
Immature Granulocytes: 0 %
Lymphocytes Relative: 46 %
Lymphs Abs: 2.8 10*3/uL (ref 0.7–4.0)
MCH: 29.9 pg (ref 26.0–34.0)
MCHC: 33.7 g/dL (ref 30.0–36.0)
MCV: 88.5 fL (ref 80.0–100.0)
Monocytes Absolute: 0.8 10*3/uL (ref 0.1–1.0)
Monocytes Relative: 13 %
Neutro Abs: 2 10*3/uL (ref 1.7–7.7)
Neutrophils Relative %: 33 %
Platelets: 167 10*3/uL (ref 150–400)
RBC: 5.66 MIL/uL (ref 4.22–5.81)
RDW: 12.6 % (ref 11.5–15.5)
WBC: 6.1 10*3/uL (ref 4.0–10.5)
nRBC: 0 % (ref 0.0–0.2)

## 2021-02-02 LAB — BASIC METABOLIC PANEL
Anion gap: 7 (ref 5–15)
BUN: 9 mg/dL (ref 6–20)
CO2: 24 mmol/L (ref 22–32)
Calcium: 9 mg/dL (ref 8.9–10.3)
Chloride: 107 mmol/L (ref 98–111)
Creatinine, Ser: 0.93 mg/dL (ref 0.61–1.24)
GFR, Estimated: >60 mL/min (ref >60)
Glucose, Bld: 92 mg/dL (ref 70–99)
Potassium: 4.2 mmol/L (ref 3.5–5.1)
Sodium: 138 mmol/L (ref 135–145)

## 2021-02-02 LAB — RESP PANEL BY RT-PCR (FLU A&B, COVID) ARPGX2
Influenza A by PCR: NEGATIVE
Influenza B by PCR: NEGATIVE
SARS Coronavirus 2 by RT PCR: POSITIVE — AB

## 2021-02-02 MED ORDER — ALBUTEROL SULFATE HFA 108 (90 BASE) MCG/ACT IN AERS
2.0000 | INHALATION_SPRAY | Freq: Once | RESPIRATORY_TRACT | Status: AC
  Administered 2021-02-02: 2 via RESPIRATORY_TRACT
  Filled 2021-02-02: qty 6.7

## 2021-02-02 NOTE — ED Triage Notes (Signed)
Pt reports having pneumonia late September or October, went to [facility] where he was placed back on antibiotics.  Pt c/o cough, CP, shob.

## 2021-02-02 NOTE — ED Provider Notes (Signed)
[facility] DEPT Provider Note   [name]: [id number] Arrival date & time: 02/02/21  1257     History Chief Complaint  Patient presents with   Cough    Derek Fitzgerald is a 40 y.o. male with no significant past medical history presented to the ED complaining of persistent cough worsening 2 days ago.  He reports that his symptoms worsened 2 days ago.  He was seen at urgent care and treated with Levaquin, which he is currently taking.  Patient reports his son with possible sick contacts.  Patient has associated chest soreness, nasal congestion, and sore throat.  He has tried over-the-counter medications with no relief of his symptoms.  He denies chest pain, shortness of breath, fever, chills, abdominal pain, nausea, vomiting, trouble swallowing.    Per patient chart review: Patient was seen at urgent care in late October and treated for pneumonia.  He was seen in urgent care on 01/31/2021 and had a repeat chest x-ray completed.  Patient was prescribed Levaquin at that time..  The history is provided by the patient. No language interpreter was used.      Past Medical History:  Diagnosis Date   Fatty liver    Hypercholesteremia     There are no problems to display for this patient.   History reviewed. No pertinent surgical history.     Family History  Problem Relation Age of Onset   Prostate cancer Father     Social History   Tobacco Use   Smoking status: Never   Smokeless tobacco: Never  Vaping Use   Vaping Use: Never used  Substance Use Topics   Alcohol use: Yes    Comment: socially   Drug use: Never    Home Medications Prior to Admission medications   Medication Sig Start Date End Date Taking? Authorizing Provider  albuterol (VENTOLIN HFA) 108 (90 Base) MCG/ACT inhaler Inhale 1-2 puffs into the lungs every 6 (six) hours as needed for wheezing or shortness of breath. 12/08/20   Teodora Medici, FNP  escitalopram (LEXAPRO) 10 MG tablet Take  10 mg by mouth daily. 10/09/20   [provider]  levofloxacin (LEVAQUIN) 500 MG tablet Take 1 tablet (500 mg total) by mouth daily. 01/31/21   Francene Finders, PA-C  meloxicam (MOBIC) 7.5 MG tablet Take 1 tablet (7.5 mg total) by mouth daily. 02/26/19   Tasia Catchings, Amy V, PA-C  methocarbamol (ROBAXIN) 500 MG tablet Take 1 tablet (500 mg total) by mouth 2 (two) times daily. 02/26/19   Tasia Catchings, Amy V, PA-C  naproxen (NAPROSYN) 500 MG tablet Take 1 tablet (500 mg total) by mouth 2 (two) times daily as needed. 10/30/20   Lucrezia Starch, MD    Allergies    Patient has no known allergies.  Review of Systems   Review of Systems  Constitutional:  Negative for chills and fever.  HENT:  Positive for congestion, postnasal drip and sore throat. Negative for trouble swallowing.   Respiratory:  Positive for cough. Negative for shortness of breath.   Cardiovascular:  Negative for chest pain.  Gastrointestinal:  Positive for vomiting (resolved). Negative for abdominal pain and nausea.  Skin:  Negative for color change, rash and wound.  All other systems reviewed and are negative.  Physical Exam Updated Vital Signs BP (!) 158/104 (BP Location: Right Arm)   Pulse 87   Temp 98.5 F (36.9 C) (Oral)   Resp 17   Ht 5\' 10"  (1.778 m)   Wt  104.3 kg   SpO2 95%   BMI 33.00 kg/m   Physical Exam Vitals and nursing note reviewed.  Constitutional:      General: He is not in acute distress.    Appearance: He is not diaphoretic.  HENT:     Head: Normocephalic and atraumatic.     Mouth/Throat:     Mouth: Mucous membranes are moist.     Pharynx: Oropharynx is clear. Uvula midline. No pharyngeal swelling, oropharyngeal exudate, posterior oropharyngeal erythema or uvula swelling.     Comments: Patent airway.  Uvula midline.  No tonsillar swelling, erythema, exudate. Eyes:     General: No scleral icterus.    Conjunctiva/sclera: Conjunctivae normal.  Cardiovascular:     Rate and Rhythm: Normal rate and  regular rhythm.     Pulses: Normal pulses.     Heart sounds: Normal heart sounds.  Pulmonary:     Effort: Pulmonary effort is normal. No respiratory distress.     Breath sounds: Normal breath sounds. No wheezing.  Abdominal:     General: Bowel sounds are normal.     Palpations: Abdomen is soft. There is no mass.     Tenderness: There is no abdominal tenderness. There is no guarding or rebound.  Musculoskeletal:        General: Normal range of motion.     Cervical back: Normal range of motion and neck supple.  Skin:    General: Skin is warm and dry.  Neurological:     Mental Status: He is alert.  Psychiatric:        Behavior: Behavior normal.    ED Results / Procedures / Treatments   Labs (all labs ordered are listed, but only abnormal results are displayed) Labs Reviewed  RESP PANEL BY RT-PCR (FLU A&B, COVID) ARPGX2 - Abnormal; Notable for the following components:      Result Value   SARS Coronavirus 2 by RT PCR POSITIVE (*)    All other components within normal limits  CBC WITH DIFFERENTIAL/PLATELET  BASIC METABOLIC PANEL    EKG EKG Interpretation  Date/Time:  Wednesday February 02 2021 21:53:56 EST Ventricular Rate:  81 PR Interval:  147 QRS Duration: 82 QT Interval:  364 QTC Calculation: 423 R Axis:   50 Text Interpretation: Sinus rhythm Borderline T wave abnormalities Confirmed by Dene Gentry [phone]) on 02/02/2021 11:01:10 PM  Radiology DG Chest 1 View  Result Date: 02/02/2021 CLINICAL DATA:  Cough and shortness of breath. EXAM: CHEST  1 VIEW COMPARISON:  01/31/2021 FINDINGS: The cardiac silhouette, mediastinal and hilar contours are normal. Some persistent streaky lingular density partially obscuring the left heart border likely post pneumonic atelectasis. No pleural effusions. No pulmonary lesions. IMPRESSION: Persistent streaky lingular density likely post pneumonic atelectasis. Electronically Signed   By: Marijo Sanes M.D.   On: 02/02/2021 14:27     Procedures Procedures   Medications Ordered in ED Medications  albuterol (VENTOLIN HFA) 108 (90 Base) MCG/ACT inhaler 2 puff (2 puffs Inhalation Given 02/02/21 2351)    ED Course  I have reviewed the triage vital signs and the nursing notes.  Pertinent labs & imaging results that were available during my care of the patient were reviewed by me and considered in my medical decision making (see chart for details).    MDM Rules/Calculators/A&P                         Patient with cough acutely worsening 2 days ago.  Patient  with possible sick contacts at home.  Patient evaluated in urgent care multiple times, with this most recent visit being 01/31/2021 and started on Levaquin at that time.  On exam patient without acute cardiovascular, respiratory, abdominal exam findings.  Differential diagnosis includes COVID, flu, pneumonia.  Swabbed for COVID, flu.  Results showed positive for COVID.  Chest x-ray with improvement from previous x-rays on 12/5 and 10/26.  This is likely COVID in the setting of treated pneumonia.  Patient is otherwise healthy and not immunocompromise.  Vital signs at discharge, patient afebrile, oxygen saturations at 95%. Case discussed with attending who agrees with discharge treatment plan.  Work note provided. Discussed with patient that they should not attend work until they are fever free for 24 hours.  Supportive care and strict return precautions discussed with patient.  Patient acknowledges and voices understanding. Appears safe for discharge at this time.  Follow-up as indicated in discharge paperwork.  Final Clinical Impression(s) / ED Diagnoses Final diagnoses:  Cough  COVID-19    Rx / DC Orders ED Discharge Orders     None        Cleopatra Sardo A, PA-C 02/03/21 0005    Valarie Merino, MD 02/05/21 2245

## 2021-02-02 NOTE — Discharge Instructions (Addendum)
Your COVID swab was positive.  You are to be on a 5-day quarantine from the start of your symptoms.  Your quarantine will end on Saturday, 02/05/2021.  Ensure to wear your mask in open spaces.  You may continue taking over-the-counter medications as directed.  Return to the emergency department if you are experiencing increasing/worsening trouble breathing, cough, unable to tolerate secretions, chest pain, or worsening symptoms.

## 2021-02-08 ENCOUNTER — Ambulatory Visit: Payer: BC Managed Care – PPO | Admitting: Family Medicine

## 2021-02-10 ENCOUNTER — Other Ambulatory Visit: Payer: Self-pay

## 2021-02-10 ENCOUNTER — Telehealth (INDEPENDENT_AMBULATORY_CARE_PROVIDER_SITE_OTHER): Payer: BC Managed Care – PPO | Admitting: Nurse Practitioner

## 2021-02-10 DIAGNOSIS — U071 COVID-19: Secondary | ICD-10-CM | POA: Diagnosis not present

## 2021-02-10 NOTE — Patient Instructions (Addendum)
Covid 19 Cough:   Stay well hydrated  Stay active  Deep breathing exercises  May take tylenol or fever or pain  May take mucinex twice daily  May take zyrtec daily  Will order prednisone  Will order Symbicort  Will order Singulair     Follow up:  Follow up in 4 weeks or sooner if needed

## 2021-02-10 NOTE — Progress Notes (Signed)
Virtual Visit via Telephone Note  I connected with Derek Fitzgerald on 02/11/21 at  3:40 PM EST by telephone and verified that I am speaking with the correct person using two identifiers.  Location: Patient: home Provider: office   I discussed the limitations, risks, security and privacy concerns of performing an evaluation and management service by telephone and the availability of in person appointments. I also discussed with the patient that there may be a patient responsible charge related to this service. The patient expressed understanding and agreed to proceed.   History of Present Illness:  Patient presents today for sick visit through telephone visit.  Patient does not currently have a PCP and would like to establish care at this office.  Patient was diagnosed with pneumonia in October.  He states that he has been having issues with ongoing cough since that time.  Patient does have a history of frequent bronchitis.  He did test positive for COVID on 02/02/2021 in the ED but was not prescribed oral antiviral therapy.  He has just recently finished a round of Levaquin as of yesterday. Denies f/c/s, n/v/d, hemoptysis, PND, chest pain or edema.      Observations/Objective:  Vitals with BMI 02/02/2021 02/02/2021 02/02/2021  Height - - -  Weight - - -  BMI - - -  Systolic 062 694 854  Diastolic 627 79 035  Pulse 87 80 87      Assessment and Plan:  Covid 19 Cough:   Stay well hydrated  Stay active  Deep breathing exercises  May take tylenol or fever or pain  May take mucinex twice daily  May take zyrtec daily  Will order prednisone  Will order Symbicort  Will order Singulair     Follow up:  Follow up in 4 weeks or sooner if needed      I discussed the assessment and treatment plan with the patient. The patient was provided an opportunity to ask questions and all were answered. The patient agreed with the plan and demonstrated an understanding of the  instructions.   The patient was advised to call back or seek an in-person evaluation if the symptoms worsen or if the condition fails to improve as anticipated.  I provided 23 minutes of non-face-to-face time during this encounter.   Fenton Foy, NP

## 2021-02-11 ENCOUNTER — Encounter: Payer: Self-pay | Admitting: Nurse Practitioner

## 2021-02-11 MED ORDER — MONTELUKAST SODIUM 10 MG PO TABS: 10.0000 mg | ORAL_TABLET | Freq: Every day | ORAL | 0 refills | Status: DC

## 2021-02-11 MED ORDER — BUDESONIDE-FORMOTEROL FUMARATE 80-4.5 MCG/ACT IN AERO: 2.0000 | INHALATION_SPRAY | Freq: Two times a day (BID) | RESPIRATORY_TRACT | 0 refills | Status: DC

## 2021-02-11 MED ORDER — PREDNISONE 10 MG PO TABS: ORAL_TABLET | ORAL | 0 refills | Status: DC

## 2021-03-01 ENCOUNTER — Encounter (HOSPITAL_COMMUNITY): Payer: Self-pay | Admitting: Radiology

## 2021-03-10 ENCOUNTER — Other Ambulatory Visit: Payer: Self-pay | Admitting: Nurse Practitioner

## 2021-03-10 ENCOUNTER — Ambulatory Visit (INDEPENDENT_AMBULATORY_CARE_PROVIDER_SITE_OTHER): Payer: BC Managed Care – PPO

## 2021-03-10 ENCOUNTER — Other Ambulatory Visit: Payer: Self-pay

## 2021-03-10 ENCOUNTER — Ambulatory Visit: Payer: BC Managed Care – PPO | Admitting: Nurse Practitioner

## 2021-03-10 VITALS — BP 133/78 | HR 86 | Resp 18

## 2021-03-10 DIAGNOSIS — Z8616 Personal history of COVID-19: Secondary | ICD-10-CM

## 2021-03-10 DIAGNOSIS — Z8709 Personal history of other diseases of the respiratory system: Secondary | ICD-10-CM

## 2021-03-10 DIAGNOSIS — R062 Wheezing: Secondary | ICD-10-CM

## 2021-03-10 MED ORDER — BUDESONIDE-FORMOTEROL FUMARATE 160-4.5 MCG/ACT IN AERO: 2.0000 | INHALATION_SPRAY | Freq: Two times a day (BID) | RESPIRATORY_TRACT | 3 refills | Status: DC

## 2021-03-10 MED ORDER — PREDNISONE 20 MG PO TABS: 20.0000 mg | ORAL_TABLET | Freq: Every day | ORAL | 0 refills | Status: AC

## 2021-03-10 NOTE — Progress Notes (Signed)
@Patient  ID: Derek Fitzgerald, male    DOB: 1980/09/20, 41 y.o.   MRN: [id number]  Chief Complaint  Patient presents with   Wheezing    Referring provider: No ref. provider found  HPI  Patient presents today for sick visit.  Patient was originally diagnosed with pneumonia on December 22, 2020.  He has not had a clear chest x-ray since that time.  He continues to have slight cough, wheezing, shortness of breath.  He has had multiple rounds of antibiotics and steroids.  His last chest x-ray was on 02/02/2021.  He was seen by me on 02/10/2021 and was started on Symbicort and another round of prednisone.  He states that he has slightly improved but is still having issues.  We will check a chest x-ray today.  Patient may need referral to pulmonary for PFT and possible CT scan if chest x-ray is not clear. Denies f/c/s, n/v/d, hemoptysis, PND, chest pain or edema.    No Known Allergies   There is no immunization history on file for this patient.  Past Medical History:  Diagnosis Date   Fatty liver    Hypercholesteremia     Tobacco History: Social History   Tobacco Use  Smoking Status Never  Smokeless Tobacco Never   Counseling given: Not Answered   Outpatient Encounter Medications as of 03/10/2021  Medication Sig   budesonide-formoterol (SYMBICORT) 160-4.5 MCG/ACT inhaler Inhale 2 puffs into the lungs 2 (two) times daily.   predniSONE (DELTASONE) 20 MG tablet Take 1 tablet (20 mg total) by mouth daily with breakfast for 5 days.   albuterol (VENTOLIN HFA) 108 (90 Base) MCG/ACT inhaler Inhale 1-2 puffs into the lungs every 6 (six) hours as needed for wheezing or shortness of breath.   budesonide-formoterol (SYMBICORT) 80-4.5 MCG/ACT inhaler Inhale 2 puffs into the lungs 2 (two) times daily.   escitalopram (LEXAPRO) 10 MG tablet Take 10 mg by mouth daily.   levofloxacin (LEVAQUIN) 500 MG tablet Take 1 tablet (500 mg total) by mouth daily.   montelukast (SINGULAIR) 10 MG tablet Take 1  tablet (10 mg total) by mouth at bedtime.   [DISCONTINUED] predniSONE (DELTASONE) 10 MG tablet Take 4 tabs for 2 days, then 3 tabs for 2 days, then 2 tabs for 2 days, then 1 tab for 2 days, then stop   No facility-administered encounter medications on file as of 03/10/2021.     Review of Systems  Review of Systems  Constitutional: Negative.   HENT: Negative.    Respiratory:  Positive for cough, shortness of breath and wheezing.   Cardiovascular: Negative.   Gastrointestinal: Negative.   Allergic/Immunologic: Negative.   Neurological: Negative.   Psychiatric/Behavioral: Negative.        Physical Exam  BP 133/78    Pulse 86    Resp 18    SpO2 97%   Wt Readings from Last 5 Encounters:  02/02/21 230 lb (104.3 kg)  01/31/21 230 lb (104.3 kg)  12/24/20 235 lb (106.6 kg)  12/08/20 240 lb (108.9 kg)  10/30/20 250 lb (113.4 kg)     Physical Exam Vitals and nursing note reviewed.  Constitutional:      General: He is not in acute distress.    Appearance: He is well-developed.  Cardiovascular:     Rate and Rhythm: Normal rate and regular rhythm.  Pulmonary:     Effort: Pulmonary effort is normal.     Breath sounds: Normal breath sounds.     Comments: Lungs clear. Wheezing appears  to be more upper airway involved. Skin:    General: Skin is warm and dry.  Neurological:     Mental Status: He is alert and oriented to person, place, and time.      Assessment & Plan:   History of COVID-19 Wheezing:   Stay well hydrated  Stay active  Deep breathing exercises  May take tylenol for fever or pain  May take mucinex DM twice daily  Continue Symbicort - will increase dose  Continue Singulair  Will order prednisone  Will place referral to pulmonary  Will order chest xray   Follow up:  Follow up for physical with Dr. Kennith Gain, NP 03/11/2021

## 2021-03-10 NOTE — Patient Instructions (Addendum)
History of Covid 19 Wheezing:   Stay well hydrated  Stay active  Deep breathing exercises  May take tylenol for fever or pain  May take mucinex DM twice daily  Continue Symbicort - will increase dose  Continue Singulair  Will order prednisone  Will place referral to pulmonary  Will order chest xray   Follow up:  Follow up for physical with Dr. Redmond Pulling

## 2021-03-11 ENCOUNTER — Encounter: Payer: Self-pay | Admitting: Nurse Practitioner

## 2021-03-11 DIAGNOSIS — R062 Wheezing: Secondary | ICD-10-CM | POA: Insufficient documentation

## 2021-03-11 DIAGNOSIS — Z8709 Personal history of other diseases of the respiratory system: Secondary | ICD-10-CM | POA: Insufficient documentation

## 2021-03-11 DIAGNOSIS — Z8616 Personal history of COVID-19: Secondary | ICD-10-CM | POA: Insufficient documentation

## 2021-03-11 NOTE — Assessment & Plan Note (Signed)
Wheezing:   Stay well hydrated  Stay active  Deep breathing exercises  May take tylenol for fever or pain  May take mucinex DM twice daily  Continue Symbicort - will increase dose  Continue Singulair  Will order prednisone  Will place referral to pulmonary  Will order chest xray   Follow up:  Follow up for physical with Dr. Redmond Pulling

## 2021-03-14 ENCOUNTER — Telehealth: Payer: Self-pay | Admitting: Nurse Practitioner

## 2021-03-31 ENCOUNTER — Other Ambulatory Visit: Payer: Self-pay

## 2021-03-31 ENCOUNTER — Ambulatory Visit: Payer: BC Managed Care – PPO | Admitting: Family Medicine

## 2021-03-31 ENCOUNTER — Encounter: Payer: Self-pay | Admitting: Internal Medicine

## 2021-03-31 ENCOUNTER — Ambulatory Visit: Payer: BC Managed Care – PPO | Admitting: Internal Medicine

## 2021-03-31 ENCOUNTER — Other Ambulatory Visit: Payer: Self-pay | Admitting: Internal Medicine

## 2021-03-31 VITALS — BP 134/74 | HR 94 | Temp 98.4°F | Ht 71.0 in | Wt 246.0 lb

## 2021-03-31 DIAGNOSIS — J4541 Moderate persistent asthma with (acute) exacerbation: Secondary | ICD-10-CM | POA: Diagnosis not present

## 2021-03-31 DIAGNOSIS — R9389 Abnormal findings on diagnostic imaging of other specified body structures: Secondary | ICD-10-CM | POA: Diagnosis not present

## 2021-03-31 MED ORDER — FLUTICASONE-SALMETEROL 250-50 MCG/ACT IN AEPB: 1.0000 | INHALATION_SPRAY | Freq: Two times a day (BID) | RESPIRATORY_TRACT | 3 refills | Status: DC

## 2021-03-31 MED ORDER — ALBUTEROL SULFATE HFA 108 (90 BASE) MCG/ACT IN AERS: 1.0000 | INHALATION_SPRAY | Freq: Four times a day (QID) | RESPIRATORY_TRACT | 5 refills | Status: DC | PRN

## 2021-03-31 MED ORDER — ALBUTEROL SULFATE (2.5 MG/3ML) 0.083% IN NEBU: 2.5000 mg | INHALATION_SOLUTION | Freq: Four times a day (QID) | RESPIRATORY_TRACT | 12 refills | Status: DC | PRN

## 2021-03-31 NOTE — Patient Instructions (Addendum)
Please schedule follow up scheduled with myself in 1 months.  If my schedule is not open yet, we will contact you with a reminder closer to that time. Please call [phone] if you haven't heard from [place] a month before.   Before your next visit I would like you to have: CT Scan Spirometry with bronchodilator(before next visit)  Start taking advair 1 puff twice a day, gargle after use.   Continue albuterol inhaler  I am also sending a nebulizer machine to your pharmacy  Take the albuterol rescue inhaler every 4 to 6 hours as needed for wheezing or shortness of breath. You can also take it 15 minutes before exercise or exertional activity. Side effects include heart racing or pounding, jitters or anxiety. If you have a history of an irregular heart rhythm, it can make this worse. Can also give some patients a hard time sleeping.

## 2021-03-31 NOTE — Progress Notes (Signed)
Derek Fitzgerald    [id number]    1980-11-10  Primary Care Physician:Patient, No Pcp Per (Inactive)  Referring Physician: Fenton Foy, NP [place],  [place] [address] Reason for Consultation: shortness of breath after covid Date of Consultation: 03/31/2021  Chief complaint:   Chief Complaint  Patient presents with   Consult    Cough, wheezing, SOB since Oct 2022     HPI: Derek Fitzgerald is a 41 y.o. man who presents for new patient evaluation of cough and shortness of breath.  Symptoms started in October 2022 and he had an xray which diagnosed pneumonia. He has had repeat chest xrays since then which show a persistent lingular opacity which has not improved.   He has had 3 rounds of steroids and antibiotics. He has been prescribed inhalers for his breathing.  He has shortness of breath, chest pain which is usually in the morning or at night. Last night he had to take a puff of his albuterol inhaler for symptoms of tightness in his chest.    Albuterol inhaler helps a little for his breathing. The symbicort inhaler he takes 2 puffs in the morning, 2 puffs at night. He has been on this for about a month. He is taking albuterol   Had childhood bronchitis.  He has a history of asthma diagnosed in the early 2000s. It was mild and intermittent and he would flare with seasonal allergies in the fall. Prior to October he didn't require prednisone for his breathing but used to get steroid injections most recently in 2013. For the last ten years his symptoms have been ok.   Allergies are currently well controlled. He has reflux and is not on therapy for that currently.   Social history:  Occupation: he works nights at [employer].  Smoking history: former social smoker with pipe, never vaped, no regular cigarette use. Very remote marijuana.   Social History   Occupational History   Not on file  Tobacco Use   Smoking status: Never   Smokeless tobacco: Never  Vaping Use    Vaping Use: Never used  Substance and Sexual Activity   Alcohol use: Yes    Comment: socially   Drug use: Never   Sexual activity: Not on file    Relevant family history:  Family History  Problem Relation Age of Onset   Prostate cancer Father    Asthma Son     Past Medical History:  Diagnosis Date   Fatty liver    Hypercholesteremia     History reviewed. No pertinent surgical history.   Physical Exam: Blood pressure 134/74, pulse 94, temperature 98.4 F (36.9 C), temperature source Oral, height 5\' 11"  (1.803 m), weight 246 lb (111.6 kg), SpO2 94 %. Gen:      No acute distress ENT:  no nasal polyps, mucus membranes moist Lungs:    No increased respiratory effort, symmetric chest wall excursion, clear to auscultation bilaterally, bilateral end expiratory wheezing worse on the left than on the right.  CV:         Regular rate and rhythm; no murmurs, rubs, or gallops.  No pedal edema Abd:      + bowel sounds; soft, non-tender; no distension MSK: no acute synovitis of DIP or PIP joints, no mechanics hands.  Skin:      Warm and dry; no rashes Neuro: normal speech, no focal facial asymmetry Psych: alert and oriented x3, normal mood and affect  Data Reviewed/Medical Decision Making:  Independent interpretation of tests: Imaging:  Review of patient's chest xray images revealed persistent lingular opacity on multiple xrays from October 2022. The patient's images have been independently reviewed by me.  CT Abdomen renal shows lingular atelectasis in few lung windows  PFTs: No flowsheet data found.  Labs:  Lab Results  Component Value Date   WBC 6.1 02/02/2021   HGB 16.9 02/02/2021   HCT 50.1 02/02/2021   MCV 88.5 02/02/2021   PLT 167 02/02/2021   Lab Results  Component Value Date   NA 138 02/02/2021   K 4.2 02/02/2021   CL 107 02/02/2021   CO2 24 02/02/2021     Immunization status:   There is no immunization history on file for this patient.   I reviewed  prior external note(s) from ED, urgent care  I reviewed the result(s) of the labs and imaging as noted above.   I have ordered spiro and feno at next visit, ct chest   Assessment:  Moderate persistent asthma with acute exacerbation Persistent lingular opacity  Plan/Recommendations:  He has chronic asthma that has worsened since covid infection and pneumonia in the fall. We discussed prednisone but he'd like to hold for now given side effects and he's already had three courses. Will increase ICS therapy to advair 250 1 puff twice a day with albuterol prn.  Will obtain dedicated CT Chest to follow up on abnormal chest xray. Will set him up with a nebulizer machine. Spirometry ordered before next visit with feno.   We discussed disease management and progression at length today.    Return to Care: Return in about 4 weeks (around 04/28/2021).  Lenice Llamas, MD [facility]monary [facility]  CC: Fenton Foy, NP

## 2021-03-31 NOTE — Progress Notes (Signed)
The patient has been prescribed the inhaler advair. Inhaler technique was demonstrated to patient. The patient subsequently demonstrated correct technique.  

## 2021-04-01 ENCOUNTER — Telehealth: Payer: Self-pay

## 2021-04-01 DIAGNOSIS — J4541 Moderate persistent asthma with (acute) exacerbation: Secondary | ICD-10-CM

## 2021-04-01 NOTE — Telephone Encounter (Signed)
Please schedule pt with Dr. Shearon Stalls in 1 month (March 2023) and schedule for a pre and post spirometry on PFT schedule prior to [facility] appointment. Thank you.

## 2021-04-01 NOTE — Telephone Encounter (Signed)
Patient is scheduled for pre & post spiro on 05/03/2021 at 3pm followed by ov with Dr. Shearon Stalls. Nothing further needed.

## 2021-04-03 ENCOUNTER — Emergency Department (HOSPITAL_COMMUNITY): Payer: BC Managed Care – PPO

## 2021-04-03 ENCOUNTER — Encounter (HOSPITAL_COMMUNITY): Payer: Self-pay

## 2021-04-03 ENCOUNTER — Emergency Department (HOSPITAL_COMMUNITY)
Admission: EM | Admit: 2021-04-03 | Discharge: 2021-04-04 | Disposition: A | Discharge status: Home / Self Care | Payer: BC Managed Care – PPO | Attending: Emergency Medicine | Admitting: Emergency Medicine

## 2021-04-03 ENCOUNTER — Other Ambulatory Visit: Payer: Self-pay

## 2021-04-03 DIAGNOSIS — R9389 Abnormal findings on diagnostic imaging of other specified body structures: Secondary | ICD-10-CM

## 2021-04-03 DIAGNOSIS — R918 Other nonspecific abnormal finding of lung field: Secondary | ICD-10-CM | POA: Insufficient documentation

## 2021-04-03 DIAGNOSIS — R0600 Dyspnea, unspecified: Secondary | ICD-10-CM | POA: Diagnosis not present

## 2021-04-03 DIAGNOSIS — R0602 Shortness of breath: Secondary | ICD-10-CM | POA: Diagnosis present

## 2021-04-03 DIAGNOSIS — R0789 Other chest pain: Secondary | ICD-10-CM | POA: Insufficient documentation

## 2021-04-03 LAB — CBC
HCT: 49.2 % (ref 39.0–52.0)
Hemoglobin: 16.7 g/dL (ref 13.0–17.0)
MCH: 30.1 pg (ref 26.0–34.0)
MCHC: 33.9 g/dL (ref 30.0–36.0)
MCV: 88.6 fL (ref 80.0–100.0)
Platelets: 65 10*3/uL — ABNORMAL LOW (ref 150–400)
RBC: 5.55 MIL/uL (ref 4.22–5.81)
RDW: 12.8 % (ref 11.5–15.5)
WBC: 7 10*3/uL (ref 4.0–10.5)
nRBC: 0 % (ref 0.0–0.2)

## 2021-04-03 LAB — BASIC METABOLIC PANEL
Anion gap: 8 (ref 5–15)
BUN: 11 mg/dL (ref 6–20)
CO2: 23 mmol/L (ref 22–32)
Calcium: 9.1 mg/dL (ref 8.9–10.3)
Chloride: 107 mmol/L (ref 98–111)
Creatinine, Ser: 0.97 mg/dL (ref 0.61–1.24)
GFR, Estimated: >60 mL/min (ref >60)
Glucose, Bld: 95 mg/dL (ref 70–99)
Potassium: 3.6 mmol/L (ref 3.5–5.1)
Sodium: 138 mmol/L (ref 135–145)

## 2021-04-03 LAB — TROPONIN I (HIGH SENSITIVITY)
Troponin I (High Sensitivity): 8 ng/L (ref <18)
Troponin I (High Sensitivity): 8 ng/L (ref <18)

## 2021-04-03 MED ORDER — IOHEXOL 300 MG/ML  SOLN
75.0000 mL | Freq: Once | INTRAMUSCULAR | Status: AC | PRN
  Administered 2021-04-03: 75 mL via INTRAVENOUS

## 2021-04-03 NOTE — Discharge Instructions (Signed)
Continue your current prescribed medications.  Call the pulmonary office in the morning to tell them you had the CAT scan, that it was abnormal, and we suggested you see them as soon as possible to arrange a bronchoscopy.

## 2021-04-03 NOTE — ED Provider Notes (Signed)
[facility] DEPT Provider Note   [name]: [id number] Arrival date & time: 04/03/21  2057     History  Chief Complaint  Patient presents with   Chest Pain   Shortness of Breath    Derek Fitzgerald is a 41 y.o. male.  HPI He presents for evaluation of persistent cough with shortness of breath and burning sensation in his chest.  Onset of symptoms, several months ago.  He has been treated multiple times.  He saw pulmonology, several days ago and was started on Advair and albuterol inhaler.  They ordered a CT of the chest to evaluate chronic chest x-ray abnormality.  That has not been performed yet.  Patient denies fever, chills, nausea, vomiting, weakness or dizziness.    Home Medications Prior to Admission medications   Medication Sig Start Date End Date Taking? Authorizing Provider  albuterol (VENTOLIN HFA) 108 (90 Base) MCG/ACT inhaler Inhale 1-2 puffs into the lungs every 6 (six) hours as needed for wheezing or shortness of breath. Patient taking differently: Inhale 1-2 puffs into the lungs every 4 (four) hours as needed for wheezing or shortness of breath. 03/31/21  Yes Spero Geralds, MD  CLARITIN 10 MG tablet Take 10 mg by mouth daily as needed for allergies or rhinitis.   Yes [provider]  FLONASE ALLERGY RELIEF 50 MCG/ACT nasal spray Place 1-2 sprays into both nostrils 2 (two) times daily as needed for allergies or rhinitis.   Yes [provider]  fluticasone-salmeterol (ADVAIR DISKUS) 250-50 MCG/ACT AEPB Inhale 1 puff into the lungs in the morning and at bedtime. 03/31/21  Yes Spero Geralds, MD  MUCINEX 600 MG 12 hr tablet Take 600 mg by mouth 2 (two) times daily as needed for to loosen phlegm or cough.   Yes [provider]  Gunter Take 1 packet by mouth every 6 (six) hours as needed (for flu-like symptoms- mix into 6 ounces of water and drink).   Yes [provider]  albuterol  (PROVENTIL) (2.5 MG/3ML) 0.083% nebulizer solution Take 3 mLs (2.5 mg total) by nebulization every 6 (six) hours as needed for wheezing or shortness of breath. Patient not taking: Reported on 04/03/2021 03/31/21   Spero Geralds, MD  escitalopram (LEXAPRO) 10 MG tablet Take 10 mg by mouth daily. Patient not taking: Reported on 04/03/2021 10/09/20   [provider]  montelukast (SINGULAIR) 10 MG tablet Take 1 tablet (10 mg total) by mouth at bedtime. Patient not taking: Reported on 04/03/2021 02/11/21 04/03/21  Fenton Foy, NP      Allergies    Other    Review of Systems   Review of Systems  Physical Exam Updated Vital Signs BP (!) 169/106    Pulse 91    Temp 98.1 F (36.7 C) (Oral)    Resp 14    SpO2 96%  Physical Exam Vitals and nursing note reviewed.  Constitutional:      General: He is not in acute distress.    Appearance: He is well-developed. He is not ill-appearing, toxic-appearing or diaphoretic.  HENT:     Head: Normocephalic and atraumatic.     Right Ear: External ear normal.     Left Ear: External ear normal.     Mouth/Throat:     Mouth: Mucous membranes are moist.     Pharynx: No oropharyngeal exudate.  Eyes:     Conjunctiva/sclera: Conjunctivae normal.     Pupils: Pupils are  equal, round, and reactive to light.  Neck:     Trachea: Phonation normal.  Cardiovascular:     Rate and Rhythm: Normal rate and regular rhythm.     Heart sounds: Normal heart sounds.  Pulmonary:     Effort: Pulmonary effort is normal. No respiratory distress.     Breath sounds: Normal breath sounds. No stridor. No wheezing or rhonchi.  Abdominal:     Palpations: Abdomen is soft.     Tenderness: There is no abdominal tenderness.  Musculoskeletal:        General: Normal range of motion.     Cervical back: Normal range of motion and neck supple.  Skin:    General: Skin is warm and dry.  Neurological:     Mental Status: He is alert and oriented to person, place, and time.      Cranial Nerves: No cranial nerve deficit.     Sensory: No sensory deficit.     Motor: No abnormal muscle tone.     Coordination: Coordination normal.  Psychiatric:        Mood and Affect: Mood normal.        Behavior: Behavior normal.        Thought Content: Thought content normal.        Judgment: Judgment normal.    ED Results / Procedures / Treatments   Labs (all labs ordered are listed, but only abnormal results are displayed) Labs Reviewed  CBC - Abnormal; Notable for the following components:      Result Value   Platelets 65 (*)    All other components within normal limits  BASIC METABOLIC PANEL  TROPONIN I (HIGH SENSITIVITY)  TROPONIN I (HIGH SENSITIVITY)    EKG EKG Interpretation  Date/Time:  Sunday April 03 2021 21:06:47 EST Ventricular Rate:  96 PR Interval:  136 QRS Duration: 89 QT Interval:  322 QTC Calculation: 407 R Axis:   52 Text Interpretation: Sinus rhythm Nonspecific T abnormalities, inferior leads since last tracing no significant change Confirmed by Daleen Bo [id number] on 04/03/2021 10:17:02 PM  Radiology DG Chest 2 View  Result Date: 04/03/2021 CLINICAL DATA:  Chest pain. EXAM: CHEST - 2 VIEW COMPARISON:  03/10/2021 and prior studies FINDINGS: Increasing LEFT perihilar opacities noted. The cardiomediastinal silhouette is unremarkable. The RIGHT lung is clear. No large pleural effusion or pneumothorax noted. IMPRESSION: Increasing LEFT perihilar opacities which may represent increasing airspace disease/pneumonia or atelectasis. Electronically Signed   By: Margarette Canada M.D.   On: 04/03/2021 21:24   CT Chest W Contrast  Result Date: 04/03/2021 CLINICAL DATA:  Cough, chronic/persisting for more than 8 weeks. EXAM: CT CHEST WITH CONTRAST TECHNIQUE: Multidetector CT imaging of the chest was performed during intravenous contrast administration. RADIATION DOSE REDUCTION: This exam was performed according to the departmental dose-optimization program which  includes automated exposure control, adjustment of the mA and/or kV according to patient size and/or use of iterative reconstruction technique. CONTRAST:  6mL OMNIPAQUE IOHEXOL 300 MG/ML  SOLN COMPARISON:  04/03/2021. FINDINGS: Cardiovascular: The heart is normal in size and there is no pericardial effusion. The aorta and pulmonary trunk are normal in caliber. Mediastinum/Nodes: No mediastinal, hilar, or axillary lymphadenopathy. The thyroid gland, trachea, and esophagus are within normal limits. Mild pneumomediastinum. Lungs/Pleura: The left upper lobe bronchus is occluded in there is left upper lobe collapse with reduced lung volume. A trace pneumothorax is noted is noted anterior to the left upper lobe. The right lung is clear. No effusion. Upper Abdomen: Fatty  infiltration of the liver is noted. Musculoskeletal: No chest wall abnormality. No acute or significant osseous findings. IMPRESSION: 1. Occlusion of the left upper lobe bronchus with left upper lobe collapse. The possibility of underlying pneumonia or neoplasm can not be excluded. Bronchoscopy may be beneficial for further evaluation. 2. Trace left pneumothorax and pneumomediastinum. 3. Hepatic steatosis. Critical findings were given to Dr. Eulis Foster at 11:17 p.m. Electronically Signed   By: Brett Fairy M.D.   On: 04/03/2021 23:18    Procedures Procedures    Medications Ordered in ED Medications  iohexol (OMNIPAQUE) 300 MG/ML solution 75 mL (75 mLs Intravenous Contrast Given 04/03/21 2248)    ED Course/ Medical Decision Making/ A&P                           Medical Decision Making Patient presenting for ongoing symptoms for several months, with abnormal chest x-ray despite multiple treatments.  He was recently evaluated by pulmonologist, who plans on further testing with CT of the chest.    Amount and/or Complexity of Data Reviewed External Data Reviewed: labs, radiology and notes.    Details: Pulmonary consultation 03/31/2021 Labs:  ordered.    Details: CBC, metabolic panel, troponin-normal except platelets low at 65 Radiology: ordered and independent interpretation performed.    Details: Chest x-ray-left lingular infiltrate versus atelectasis.  CT chest with IV contrast-left upper lobe occluded bronchus, with secondary atelectasis versus infection.  Small left-sided pneumothorax, not clinically significant ECG/medicine tests: ordered and independent interpretation performed.    Details: Normal sinus rhythm.  EKG with nonspecific ST abnormality. Discussion of management or test interpretation with external provider(s): Case discussed with radiologist regarding abnormal chest CT results.  Case discussed with intensivist who reviewed the CT results with me.  She recommends that the patient follow-up with his pulmonary provider regarding these results to schedule outpatient management.  No indication for intervention including chest tube, antibiotics or hospitalization.  Risk Prescription drug management. Risk Details: Patient with ongoing symptoms of cough, congestion, and status post treatment multiple times for pneumonia, and now being treated for bronchitis with Advair and albuterol.  Chest CT ordered to evaluate pulmonary status as chest x-ray has worsened compared to prior, 3 weeks ago.  No requirement for oxygen treatment.  Doubt sepsis, ACS, or metabolic disorder.  Disposition dependent on CT imaging.  CT showed occluded bronchus.  Consultation with pulmonologist in the ED. Dr. Patsey Berthold recommends outpatient follow-up with his current pulmonary provider.           Final Clinical Impression(s) / ED Diagnoses Final diagnoses:  Dyspnea, unspecified type  Abnormal chest x-ray    Rx / DC Orders ED Discharge Orders     None         Daleen Bo, MD 04/03/21 2353

## 2021-04-03 NOTE — ED Triage Notes (Addendum)
Pt reports with shortness of breath and chest pressure since Friday. Pt states that he has PNA. Pt was diagnosed with PNA in October of last year.

## 2021-04-04 ENCOUNTER — Telehealth: Payer: Self-pay | Admitting: Internal Medicine

## 2021-04-04 DIAGNOSIS — J9811 Atelectasis: Secondary | ICD-10-CM

## 2021-04-04 MED ORDER — ADVAIR HFA 230-21 MCG/ACT IN AERO: 2.0000 | INHALATION_SPRAY | Freq: Two times a day (BID) | RESPIRATORY_TRACT | 3 refills | Status: DC

## 2021-04-04 NOTE — Telephone Encounter (Signed)
Called and spoke with patient. He stated that he decided to go the ER yesterday at [facility] since his SOB and chest pain had increased since his visit with Dr. Shearon Stalls on 03/31/21. He noticed the increase in SOB and chest after using the Advair inhaler for the first time. He completed a CT scan while there.   Since he has been home, the SOB has decreased but his chest still feels tight.   While he was in the ED, they mentioned to him that he may benefit from bronchoscopy and BAL and that he would need to contact our office.   He is currently scheduled for a PFT on 05/03/21 at 3pm and OV at 330pm.   Dr. Shearon Stalls, can you please advise? Thanks!

## 2021-04-04 NOTE — Telephone Encounter (Signed)
I called the patient. He needs a bronchoscopy to evaluate that CT scan further.

## 2021-04-05 NOTE — Telephone Encounter (Signed)
Order was placed for bronch that is in our workque.  Will copy/paste info for procedure pool -  Please schedule the following: Provider performing procedure:nikita desai Diagnosis: lung collapse Which side if for nodule / mass? Left side Procedure: bronchoscopy with BAL Has patient been spoken to by Provider and given informed consent? yes Anesthesia: MAC Do you need Fluro? no Duration of procedure: 30 minutes Date: 2/13-2/16 AM or PM Alternate Date:  Time:  Location: [facility] Does patient have OSA? no DM? no Or Latex allergy? no Medication Restriction: none Anticoagulate/Antiplatelet: n/a Pre-op Labs Ordered:determined by Anesthesia Imaging request: n/a  (If, SuperDimension CT Chest, please have STAT courier sent to ENDO) Please coordinate Pre-op COVID Testing

## 2021-04-08 NOTE — Telephone Encounter (Signed)
Called central scheduling at [phone] to get bronch scheduled and was told they have no outpatient slots open only inpatient slots.   Dr. Shearon Stalls please advise

## 2021-04-08 NOTE — Telephone Encounter (Signed)
Called [facility] to get bronch scheduled and they said they were going to have to call me back because they were going to try and move a patient. Advised them to ask for Magda Paganini as I would not be here

## 2021-04-08 NOTE — Telephone Encounter (Signed)
Called and spoke with patient. Let them know their Bronch is scheduled for Wednesday 04/13/2021 at 11 am with Dr. Shearon Stalls at [facility].  Patient was instructed to arrive at hospital at 9:30 am. They were instructed to bring someone with them as they will not be able to drive home from procedure. Patient instructed not to have anything to eat or drink after midnight.   Patient's covid screening will be done on Monday 04/11/2021  Patient voiced understanding, nothing further needed  Routing to Dr. Shearon Stalls as Juluis Rainier

## 2021-04-12 ENCOUNTER — Encounter (HOSPITAL_COMMUNITY): Payer: Self-pay | Admitting: Internal Medicine

## 2021-04-13 ENCOUNTER — Ambulatory Visit (HOSPITAL_COMMUNITY)
Admission: RE | Admit: 2021-04-13 | Discharge: 2021-04-13 | Disposition: A | Discharge status: Home / Self Care | Payer: BC Managed Care – PPO | Source: Ambulatory Visit | Attending: Internal Medicine | Admitting: Internal Medicine

## 2021-04-13 ENCOUNTER — Other Ambulatory Visit: Payer: Self-pay

## 2021-04-13 ENCOUNTER — Encounter (HOSPITAL_COMMUNITY)
Admission: RE | Disposition: A | Discharge status: Home / Self Care | Payer: Self-pay | Source: Ambulatory Visit | Attending: Internal Medicine

## 2021-04-13 ENCOUNTER — Ambulatory Visit (HOSPITAL_COMMUNITY): Payer: BC Managed Care – PPO | Admitting: Anesthesiology

## 2021-04-13 ENCOUNTER — Encounter (HOSPITAL_COMMUNITY): Payer: Self-pay | Admitting: Internal Medicine

## 2021-04-13 DIAGNOSIS — D381 Neoplasm of uncertain behavior of trachea, bronchus and lung: Secondary | ICD-10-CM

## 2021-04-13 DIAGNOSIS — D3A090 Benign carcinoid tumor of the bronchus and lung: Secondary | ICD-10-CM | POA: Insufficient documentation

## 2021-04-13 DIAGNOSIS — J9811 Atelectasis: Secondary | ICD-10-CM

## 2021-04-13 DIAGNOSIS — Z6836 Body mass index (BMI) 36.0-36.9, adult: Secondary | ICD-10-CM | POA: Diagnosis not present

## 2021-04-13 DIAGNOSIS — R911 Solitary pulmonary nodule: Secondary | ICD-10-CM | POA: Diagnosis present

## 2021-04-13 HISTORY — PX: BIOPSY: SHX5522

## 2021-04-13 HISTORY — PX: VIDEO BRONCHOSCOPY: SHX5072

## 2021-04-13 HISTORY — PX: BRONCHIAL BRUSHINGS: SHX5108

## 2021-04-13 HISTORY — PX: BRONCHIAL WASHINGS: SHX5105

## 2021-04-13 LAB — BODY FLUID CELL COUNT WITH DIFFERENTIAL
Eos, Fluid: 3 %
Lymphs, Fluid: 8 %
Monocyte-Macrophage-Serous Fluid: 12 % — ABNORMAL LOW (ref 50–90)
Neutrophil Count, Fluid: 77 % — ABNORMAL HIGH (ref 0–25)
Total Nucleated Cell Count, Fluid: 187 cu mm (ref 0–1000)

## 2021-04-13 SURGERY — VIDEO BRONCHOSCOPY WITHOUT FLUORO
Anesthesia: General

## 2021-04-13 MED ORDER — PROPOFOL 1000 MG/100ML IV EMUL
INTRAVENOUS | Status: AC
  Filled 2021-04-13: qty 100

## 2021-04-13 MED ORDER — ROCURONIUM BROMIDE 10 MG/ML (PF) SYRINGE
PREFILLED_SYRINGE | INTRAVENOUS | Status: DC | PRN
  Administered 2021-04-13: 70 mg via INTRAVENOUS

## 2021-04-13 MED ORDER — ONDANSETRON HCL 4 MG/2ML IJ SOLN
INTRAMUSCULAR | Status: DC | PRN
  Administered 2021-04-13: 4 mg via INTRAVENOUS

## 2021-04-13 MED ORDER — DEXAMETHASONE SODIUM PHOSPHATE 10 MG/ML IJ SOLN
INTRAMUSCULAR | Status: DC | PRN
  Administered 2021-04-13: 10 mg via INTRAVENOUS

## 2021-04-13 MED ORDER — LACTATED RINGERS IV SOLN
INTRAVENOUS | Status: AC | PRN
  Administered 2021-04-13: 1000 mL via INTRAVENOUS

## 2021-04-13 MED ORDER — LIDOCAINE 2% (20 MG/ML) 5 ML SYRINGE
INTRAMUSCULAR | Status: DC | PRN
  Administered 2021-04-13: 80 mg via INTRAVENOUS

## 2021-04-13 MED ORDER — SUGAMMADEX SODIUM 500 MG/5ML IV SOLN
INTRAVENOUS | Status: DC | PRN
  Administered 2021-04-13: 440 mg via INTRAVENOUS

## 2021-04-13 MED ORDER — PROPOFOL 10 MG/ML IV BOLUS
INTRAVENOUS | Status: DC | PRN
  Administered 2021-04-13: 200 mg via INTRAVENOUS

## 2021-04-13 MED ORDER — FENTANYL CITRATE (PF) 100 MCG/2ML IJ SOLN
INTRAMUSCULAR | Status: DC | PRN
  Administered 2021-04-13: 100 ug via INTRAVENOUS

## 2021-04-13 MED ORDER — MIDAZOLAM HCL 5 MG/5ML IJ SOLN
INTRAMUSCULAR | Status: DC | PRN
  Administered 2021-04-13: 2 mg via INTRAVENOUS

## 2021-04-13 MED ORDER — PROPOFOL 10 MG/ML IV BOLUS
INTRAVENOUS | Status: AC
  Filled 2021-04-13: qty 20

## 2021-04-13 MED ORDER — FENTANYL CITRATE (PF) 100 MCG/2ML IJ SOLN
INTRAMUSCULAR | Status: AC
  Filled 2021-04-13: qty 2

## 2021-04-13 MED ORDER — MIDAZOLAM HCL 2 MG/2ML IJ SOLN
INTRAMUSCULAR | Status: AC
  Filled 2021-04-13: qty 2

## 2021-04-13 MED ORDER — LIDOCAINE HCL 1 % IJ SOLN
INTRAMUSCULAR | Status: AC
  Filled 2021-04-13: qty 20

## 2021-04-13 NOTE — Discharge Instructions (Addendum)
I will contact once I have results from the biopsy - hopefully later this week. Keep your appointment with me. Normal to expect a little coughing up blood or low grade fever for the next 24 hours. This will improve. Ok to take tylenol for fever.    YOU HAD AN ENDOSCOPIC PROCEDURE TODAY: Refer to the procedure report and other information in the discharge instructions given to you for any specific questions about what was found during the examination. If this information does not answer your questions, please call [facility] office at [phone] to clarify.   YOU SHOULD EXPECT: Some feelings of bloating in the abdomen. Passage of more gas than usual. Walking can help get rid of the air that was put into your GI tract during the procedure and reduce the bloating. If you had a lower endoscopy (such as a colonoscopy or flexible sigmoidoscopy) you may notice spotting of blood in your stool or on the toilet paper. Some abdominal soreness may be present for a day or two, also.  DIET: Your first meal following the procedure should be a light meal and then it is ok to progress to your normal diet. A half-sandwich or bowl of soup is an example of a good first meal. Heavy or fried foods are harder to digest and may make you feel nauseous or bloated. Drink plenty of fluids but you should avoid alcoholic beverages for 24 hours. If you had a esophageal dilation, please see attached instructions for diet.    ACTIVITY: Your care partner should take you home directly after the procedure. You should plan to take it easy, moving slowly for the rest of the day. You can resume normal activity the day after the procedure however YOU SHOULD NOT DRIVE, use power tools, machinery or perform tasks that involve climbing or major physical exertion for 24 hours (because of the sedation medicines used during the test).   SYMPTOMS TO REPORT IMMEDIATELY: A gastroenterologist can be reached at any hour. Please call [phone]  for any  of the following symptoms:  Following lower endoscopy (colonoscopy, flexible sigmoidoscopy) Excessive amounts of blood in the stool  Significant tenderness, worsening of abdominal pains  Swelling of the abdomen that is new, acute  Fever of 100 or higher  Following upper endoscopy (EGD, EUS, ERCP, esophageal dilation) Vomiting of blood or coffee ground material  New, significant abdominal pain  New, significant chest pain or pain under the shoulder blades  Painful or persistently difficult swallowing  New shortness of breath  Black, tarry-looking or red, bloody stools  FOLLOW UP:  If any biopsies were taken you will be contacted by phone or by letter within the next 1-3 weeks. Call [phone]  if you have not heard about the biopsies in 3 weeks.  Please also call with any specific questions about appointments or follow up tests.

## 2021-04-13 NOTE — Anesthesia Procedure Notes (Signed)
Procedure Name: Intubation Date/Time: 04/13/2021 11:29 AM Performed by: Lavina Hamman, CRNA Pre-anesthesia Checklist: Patient identified, Emergency Drugs available, Suction available, Patient being monitored and Timeout performed Patient Re-evaluated:Patient Re-evaluated prior to induction Oxygen Delivery Method: Circle system utilized Preoxygenation: Pre-oxygenation with 100% oxygen Induction Type: IV induction Ventilation: Two handed mask ventilation required and Oral airway inserted - appropriate to patient size Laryngoscope Size: Mac and 4 Grade View: Grade I Tube type: Oral Tube size: 8.5 mm Number of attempts: 1 Airway Equipment and Method: Stylet Placement Confirmation: ETT inserted through vocal cords under direct vision, positive ETCO2, CO2 detector and breath sounds checked- equal and bilateral Secured at: 23 cm Tube secured with: Tape Dental Injury: Teeth and Oropharynx as per pre-operative assessment  Comments: ATOI

## 2021-04-13 NOTE — H&P (Signed)
Patient with history of asthma and persistent LUL atelectasis on chest xray and persistent for months on CT Chest. Worsening asthma symptoms. Seen in office earlier this month. Discussed potential for bronchoscopy at that time. Presents today for bronchoscopy with BAL and airway examination.  No contraindications. Ok to proceed with procedure.   Lenice Llamas, MD Pulmonary and [place] 04/13/2021 10:14 AM Pager: see AMION  If no response to pager, please call critical care on call (see AMION) until 7pm After 7:00 pm call Elink

## 2021-04-13 NOTE — Anesthesia Postprocedure Evaluation (Signed)
Anesthesia Post Note  Patient: Derek Fitzgerald  Procedure(s) Performed: VIDEO BRONCHOSCOPY WITHOUT FLUORO BRONCHIAL WASHINGS BRONCHIAL BRUSHINGS BIOPSY     Patient location during evaluation: Endoscopy Anesthesia Type: General Level of consciousness: awake and alert Pain management: pain level controlled Vital Signs Assessment: post-procedure vital signs reviewed and stable Respiratory status: spontaneous breathing, nonlabored ventilation and respiratory function stable Cardiovascular status: blood pressure returned to baseline and stable Postop Assessment: no apparent nausea or vomiting Anesthetic complications: no   No notable events documented.  Last Vitals:  Vitals:   04/13/21 1212 04/13/21 1221  BP: (!) 144/83 (!) 152/83  Pulse: 95 98  Resp: 12 19  Temp:    SpO2: 94% 94%    Last Pain:  Vitals:   04/13/21 1221  TempSrc:   PainSc: 0-No pain                 Myking Sar,W. EDMOND

## 2021-04-13 NOTE — Transfer of Care (Signed)
Immediate Anesthesia Transfer of Care Note  Patient: Derek Fitzgerald  Procedure(s) Performed: Procedure(s): VIDEO BRONCHOSCOPY WITHOUT FLUORO (N/A) BRONCHIAL WASHINGS BRONCHIAL BRUSHINGS BIOPSY  Patient Location: PACU  Anesthesia Type:General  Level of Consciousness:  sedated, patient cooperative and responds to stimulation  Airway & Oxygen Therapy:Patient Spontanous Breathing and Patient connected to face mask oxgen  Post-op Assessment:  Report given to PACU RN and Post -op Vital signs reviewed and stable  Post vital signs:  Reviewed and stable  Last Vitals:  Vitals:   04/13/21 1020  BP: (!) 161/100  Resp: 12  Temp: (!) 36.4 C  SpO2: 32%    Complications: No apparent anesthesia complications

## 2021-04-13 NOTE — Anesthesia Preprocedure Evaluation (Addendum)
Anesthesia Evaluation  Patient identified by MRN, date of birth, ID band Patient awake    Reviewed: Allergy & Precautions, H&P , NPO status , Patient's Chart, lab work & pertinent test results  Airway Mallampati: III  TM Distance: >3 FB Neck ROM: Full    Dental no notable dental hx. (+) Teeth Intact, Dental Advisory Given   Pulmonary neg pulmonary ROS,    Pulmonary exam normal breath sounds clear to auscultation       Cardiovascular negative cardio ROS   Rhythm:Regular Rate:Normal     Neuro/Psych negative neurological ROS  negative psych ROS   GI/Hepatic negative GI ROS, Neg liver ROS,   Endo/Other  Morbid obesity  Renal/GU negative Renal ROS  negative genitourinary   Musculoskeletal   Abdominal   Peds  Hematology negative hematology ROS (+)   Anesthesia Other Findings   Reproductive/Obstetrics negative OB ROS                            Anesthesia Physical Anesthesia Plan  ASA: 2  Anesthesia Plan: General   Post-op Pain Management: Ofirmev IV (intra-op)* and Minimal or no pain anticipated   Induction: Intravenous  PONV Risk Score and Plan: 3 and Ondansetron, Dexamethasone and Midazolam  Airway Management Planned: Oral ETT  Additional Equipment:   Intra-op Plan:   Post-operative Plan: Extubation in OR  Informed Consent: I have reviewed the patients History and Physical, chart, labs and discussed the procedure including the risks, benefits and alternatives for the proposed anesthesia with the patient or authorized representative who has indicated his/her understanding and acceptance.     Dental advisory given  Plan Discussed with: CRNA  Anesthesia Plan Comments:         Anesthesia Quick Evaluation

## 2021-04-13 NOTE — Op Note (Signed)
[facility] Cardiopulmonary Patient Name: Derek Fitzgerald Procedure Date: 04/13/2021 MRN: [id number] Attending MD: Senaida Ores. Shearon Stalls MD, MD Date of Birth: Sep 15, 1980 [name]: [id number] Age: 41 Admit Type: Outpatient Ethnicity: Not Hispanic or Latino Procedure:             Bronchoscopy Indications:           Left upper lobe mass, Endobronchial mass Providers:             Senaida Ores. Shearon Stalls MD, MD, Carlyn Reichert, RN, Tyna Jaksch Technician Referring MD:           Medicines:             See the Anesthesia note for documentation of the                         administered medications Complications:         No immediate complications. Estimated blood loss:                         minimal Estimated Blood Loss:  Estimated blood loss was minimal. Procedure:      Pre-Anesthesia Assessment:      - A History and Physical has been performed. Patient meds and allergies       have been reviewed. The risks and benefits of the procedure and the       sedation options and risks were discussed with the patient. All       questions were answered and informed consent was obtained. Patient       identification and proposed procedure were verified prior to the       procedure. Mental Status Examination: normal. After reviewing the risks       and benefits, the patient was deemed in satisfactory condition to       undergo the procedure. The anesthesia plan was to use general       anesthesia. Immediately prior to administration of medications, the       patient was re-assessed for adequacy to receive sedatives. The heart       rate, respiratory rate, oxygen saturations, blood pressure, adequacy of       pulmonary ventilation, and response to care were monitored throughout       the procedure. The physical status of the patient was re-assessed after       the procedure.      After obtaining informed consent, the bronchoscope was passed under       direct vision. Throughout  the procedure, the patient's blood pressure,       pulse, and oxygen saturations were monitored continuously. the [id number]       ([id number]) Olympus bronoscope was introduced through the mouth, via the       endotracheal tube (the patient was intubated for the procedure) and       advanced to the tracheobronchial tree. The procedure was accomplished       with ease. The patient tolerated the procedure well. The total duration       of the procedure was 0 hours and 15 minutes. Findings:      The endotracheal tube is in good position. The visualized portion of the       trachea is of normal caliber. The carina  is sharp. The right       tracheobronchial tree was examined to at least the first subsegmental       level and was normal. The Right middle lobe was trifurcated. the left       bronchial tree was then examined. there was a sessile endobronchial       lesion in the LUL take off. I performed endobronchial biopsies with       alligator forceps, bronchial brushings and a bronchoalveolar lavage of       this segment and lesion. The rest of the left lung was normal. The mass       was friable and bled with interventions performed. Impression:      - Friable LUL Endobronchial mass      - The airway examination was normal.      - BAL, biopsy and brushings performed Moderate Sedation:      see anesthesia documentation Recommendation:      - Await BAL, biopsy and brushing results sent for cytology, cell count,       gram stain culture and pathology.      - Will schedule follow up in clinic.      - these findings were communicated to patient and mother following       procedure. Procedure Code(s):      --- Professional ---      [phone], Bronchoscopy, rigid or flexible, including fluoroscopic guidance,       when performed; diagnostic, with cell washing, when performed (separate       procedure) Diagnosis Code(s):      --- Professional ---      R91.8, Other nonspecific abnormal finding of  lung field CPT copyright 2019 American Medical Association. All rights reserved. The codes documented in this report are preliminary and upon coder review may  be revised to meet current compliance requirements. Liala Codispoti S. Shearon Stalls MD, MD 04/13/2021 11:51:30 AM This report has been signed electronically. Number of Addenda: 0 Scope In: Scope Out:

## 2021-04-14 ENCOUNTER — Encounter (HOSPITAL_COMMUNITY): Payer: Self-pay | Admitting: Internal Medicine

## 2021-04-15 LAB — CYTOLOGY - NON PAP

## 2021-04-15 LAB — SURGICAL PATHOLOGY

## 2021-04-16 ENCOUNTER — Encounter: Payer: Self-pay | Admitting: Internal Medicine

## 2021-04-18 ENCOUNTER — Telehealth: Payer: Self-pay | Admitting: Internal Medicine

## 2021-04-18 DIAGNOSIS — D3A090 Benign carcinoid tumor of the bronchus and lung: Secondary | ICD-10-CM

## 2021-04-18 LAB — AEROBIC/ANAEROBIC CULTURE W GRAM STAIN (SURGICAL/DEEP WOUND): Culture: NO GROWTH

## 2021-04-18 NOTE — Telephone Encounter (Signed)
This has been addressed in telephone encounter and nothing further is needed

## 2021-04-18 NOTE — Telephone Encounter (Signed)
PCCs are scheduling CT of Abd/pelvis.  I called and spoke with patient, scheduled patient to have PFT on 04/21/21 at 1 pm, advised to arrive by 12:45 pm for check in, he verbalized understanding.  Nothing further needed.

## 2021-04-18 NOTE — Telephone Encounter (Signed)
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 Will plan for CT CTAP for evaluation of other carcinoid sites. Will obtain PFTs and refer to CT Surgery for evaluation of surgical resection. He needs CT scan and PFTs before being scheduled with CT surgery.

## 2021-04-19 NOTE — H&P (Signed)
Patient presents with persistent LUL atelectasis. Here for bronchoscopy with possible endobronchial biopsy and bronchoalveolar lavage for evaluation of this.   No contraindications to proceeding with procedure. Patient ok to proceed.

## 2021-04-20 ENCOUNTER — Other Ambulatory Visit: Payer: Self-pay

## 2021-04-20 ENCOUNTER — Encounter: Payer: Self-pay | Admitting: Family Medicine

## 2021-04-20 ENCOUNTER — Ambulatory Visit (INDEPENDENT_AMBULATORY_CARE_PROVIDER_SITE_OTHER): Payer: BC Managed Care – PPO | Admitting: Family Medicine

## 2021-04-20 VITALS — BP 135/81 | HR 84 | Temp 98.1°F | Resp 16 | Ht 69.75 in | Wt 245.2 lb

## 2021-04-20 DIAGNOSIS — Z0000 Encounter for general adult medical examination without abnormal findings: Secondary | ICD-10-CM | POA: Diagnosis not present

## 2021-04-20 DIAGNOSIS — Z1329 Encounter for screening for other suspected endocrine disorder: Secondary | ICD-10-CM | POA: Diagnosis not present

## 2021-04-20 DIAGNOSIS — Z114 Encounter for screening for human immunodeficiency virus [HIV]: Secondary | ICD-10-CM

## 2021-04-20 DIAGNOSIS — Z1159 Encounter for screening for other viral diseases: Secondary | ICD-10-CM

## 2021-04-20 DIAGNOSIS — Z130 Encounter for screening for diseases of the blood and blood-forming organs and certain disorders involving the immune mechanism: Secondary | ICD-10-CM | POA: Diagnosis not present

## 2021-04-20 DIAGNOSIS — Z13220 Encounter for screening for lipoid disorders: Secondary | ICD-10-CM

## 2021-04-20 DIAGNOSIS — Z13228 Encounter for screening for other metabolic disorders: Secondary | ICD-10-CM

## 2021-04-20 NOTE — Progress Notes (Signed)
Patient said he was recently dx with lung cancer Patient is here for CPE Patient said he has fatty enzymes

## 2021-04-20 NOTE — Progress Notes (Signed)
Established Patient Office Visit  Subjective:  Patient ID: Derek Fitzgerald, male    DOB: 1980/10/11  Age: 41 y.o. MRN: [id number]  CC:  Chief Complaint  Patient presents with   Annual Exam   Medication Refill    HPI Derek Fitzgerald presents for annual physical exam. Patient reports that he was recently dx with lung cancer (non-smoker). He has not yet started treatment.   Past Medical History:  Diagnosis Date   Fatty liver    Hypercholesteremia    Lung cancer [facility])     Past Surgical History:  Procedure Laterality Date   BIOPSY  04/13/2021   Procedure: BIOPSY;  Surgeon: Spero Geralds, MD;  Location: [facility];  Service: Pulmonary;;   BRONCHIAL BRUSHINGS  04/13/2021   Procedure: BRONCHIAL BRUSHINGS;  Surgeon: Spero Geralds, MD;  Location: [facility];  Service: Pulmonary;;   BRONCHIAL WASHINGS  04/13/2021   Procedure: BRONCHIAL WASHINGS;  Surgeon: Spero Geralds, MD;  Location: [facility];  Service: Pulmonary;;   VIDEO BRONCHOSCOPY N/A 04/13/2021   Procedure: VIDEO BRONCHOSCOPY WITHOUT FLUORO;  Surgeon: Spero Geralds, MD;  Location: [facility];  Service: Pulmonary;  Laterality: N/A;    Family History  Problem Relation Age of Onset   Prostate cancer Father    Asthma Son     Social History   Socioeconomic History   Marital status: Divorced    Spouse name: Not on file   Number of children: Not on file   Years of education: Not on file   Highest education level: Not on file  Occupational History   Not on file  Tobacco Use   Smoking status: Never   Smokeless tobacco: Never  Vaping Use   Vaping Use: Never used  Substance and Sexual Activity   Alcohol use: Yes    Comment: socially   Drug use: Never   Sexual activity: Not on file  Other Topics Concern   Not on file  Social History Narrative   Not on file   Social Determinants of Health   Financial Resource Strain: Not on file  Food Insecurity: Not on file  Transportation Needs: Not on file   Physical Activity: Not on file  Stress: Not on file  Social Connections: Not on file  Intimate Partner Violence: Not on file    ROS Review of Systems  All other systems reviewed and are negative.  Objective:   Today's Vitals: BP 135/81    Pulse 84    Temp 98.1 F (36.7 C) (Oral)    Resp 16    Ht 5' 9.75" (1.772 m)    Wt 245 lb 3.2 oz (111.2 kg)    SpO2 93%    BMI 35.44 kg/m   Physical Exam Vitals and nursing note reviewed.  Constitutional:      General: He is not in acute distress. HENT:     Head: Normocephalic and atraumatic.     Right Ear: Tympanic membrane, ear canal and external ear normal.     Left Ear: Tympanic membrane, ear canal and external ear normal.     Nose: Nose normal.     Mouth/Throat:     Mouth: Mucous membranes are moist.     Pharynx: Oropharynx is clear.  Eyes:     Conjunctiva/sclera: Conjunctivae normal.     Pupils: Pupils are equal, round, and reactive to light.  Neck:     Thyroid: No thyromegaly.  Cardiovascular:     Rate and Rhythm: Normal rate and  regular rhythm.     Heart sounds: Normal heart sounds. No murmur heard. Pulmonary:     Effort: Pulmonary effort is normal.     Breath sounds: Normal breath sounds.  Abdominal:     General: There is no distension.     Palpations: Abdomen is soft. There is no mass.     Tenderness: There is no abdominal tenderness.     Hernia: There is no hernia in the left inguinal area or right inguinal area.  Genitourinary:    Penis: Normal and circumcised.      Testes: Normal.  Musculoskeletal:        General: Normal range of motion.     Cervical back: Normal range of motion and neck supple.     Right lower leg: No edema.     Left lower leg: No edema.  Skin:    General: Skin is warm and dry.  Neurological:     General: No focal deficit present.     Mental Status: He is alert and oriented to person, place, and time. Mental status is at baseline.  Psychiatric:        Mood and Affect: Mood normal.         Behavior: Behavior normal.    Assessment & Plan:   1. Annual physical exam Routine labs ordered - CMP14+EGFR  2. Screening for endocrine/metabolic/immunity disorders  - Hemoglobin A1c - TSH  3. Screening for deficiency anemia  - CBC with Differential  4. Screening for lipid disorders  - Lipid Panel  5. Screening for HIV (human immunodeficiency virus)  - HIV antibody (with reflex)  6. Need for hepatitis C screening test  - Hepatitis C Antibody  Outpatient Encounter Medications as of 04/20/2021  Medication Sig   albuterol (PROVENTIL) (2.5 MG/3ML) 0.083% nebulizer solution Take 3 mLs (2.5 mg total) by nebulization every 6 (six) hours as needed for wheezing or shortness of breath. (Patient not taking: Reported on 04/03/2021)   albuterol (VENTOLIN HFA) 108 (90 Base) MCG/ACT inhaler Inhale 1-2 puffs into the lungs every 6 (six) hours as needed for wheezing or shortness of breath. (Patient taking differently: Inhale 1-2 puffs into the lungs every 4 (four) hours as needed for wheezing or shortness of breath.)   CLARITIN 10 MG tablet Take 10 mg by mouth daily as needed for allergies or rhinitis.   escitalopram (LEXAPRO) 10 MG tablet Take 10 mg by mouth daily.   FLONASE ALLERGY RELIEF 50 MCG/ACT nasal spray Place 1-2 sprays into both nostrils 2 (two) times daily as needed for allergies or rhinitis.   fluticasone-salmeterol (ADVAIR HFA) 230-21 MCG/ACT inhaler Inhale 2 puffs into the lungs 2 (two) times daily.   montelukast (SINGULAIR) 10 MG tablet Take 1 tablet (10 mg total) by mouth at bedtime. (Patient not taking: Reported on 04/03/2021)   MUCINEX 600 MG 12 hr tablet Take 600 mg by mouth 2 (two) times daily as needed for to loosen phlegm or cough.   SYMBICORT 160-4.5 MCG/ACT inhaler Inhale 2 puffs into the lungs 2 (two) times daily.   THERAFLU SEVERE COLD & COUGH Packet MISC Take 1 packet by mouth every 6 (six) hours as needed (for flu-like symptoms- mix into 6 ounces of water and drink).    No facility-administered encounter medications on file as of 04/20/2021.    Follow-up: No follow-ups on file.   Becky Sax, MD

## 2021-04-21 ENCOUNTER — Ambulatory Visit (INDEPENDENT_AMBULATORY_CARE_PROVIDER_SITE_OTHER): Payer: BC Managed Care – PPO | Admitting: Internal Medicine

## 2021-04-21 DIAGNOSIS — J4541 Moderate persistent asthma with (acute) exacerbation: Secondary | ICD-10-CM | POA: Diagnosis not present

## 2021-04-21 LAB — CBC WITH DIFFERENTIAL/PLATELET
Basophils Absolute: 0.1 10*3/uL (ref 0.0–0.2)
Basos: 1 %
EOS (ABSOLUTE): 0.2 10*3/uL (ref 0.0–0.4)
Eos: 2 %
Hematocrit: 48.5 % (ref 37.5–51.0)
Hemoglobin: 16.5 g/dL (ref 13.0–17.7)
Immature Grans (Abs): 0 10*3/uL (ref 0.0–0.1)
Immature Granulocytes: 0 %
Lymphocytes Absolute: 4 10*3/uL — ABNORMAL HIGH (ref 0.7–3.1)
Lymphs: 53 %
MCH: 30.3 pg (ref 26.6–33.0)
MCHC: 34 g/dL (ref 31.5–35.7)
MCV: 89 fL (ref 79–97)
Monocytes Absolute: 0.7 10*3/uL (ref 0.1–0.9)
Monocytes: 9 %
Neutrophils Absolute: 2.6 10*3/uL (ref 1.4–7.0)
Neutrophils: 35 %
Platelets: 107 10*3/uL — ABNORMAL LOW (ref 150–450)
RBC: 5.45 x10E6/uL (ref 4.14–5.80)
RDW: 13.3 % (ref 11.6–15.4)
WBC: 7.5 10*3/uL (ref 3.4–10.8)

## 2021-04-21 LAB — CMP14+EGFR
ALT: 106 IU/L — ABNORMAL HIGH (ref 0–44)
AST: 42 IU/L — ABNORMAL HIGH (ref 0–40)
Albumin/Globulin Ratio: 1.6 (ref 1.2–2.2)
Albumin: 4.2 g/dL (ref 4.0–5.0)
Alkaline Phosphatase: 88 IU/L (ref 44–121)
BUN/Creatinine Ratio: 9 (ref 9–20)
BUN: 9 mg/dL (ref 6–20)
Bilirubin Total: 0.7 mg/dL (ref 0.0–1.2)
CO2: 22 mmol/L (ref 20–29)
Calcium: 9.1 mg/dL (ref 8.7–10.2)
Chloride: 106 mmol/L (ref 96–106)
Creatinine, Ser: 1.04 mg/dL (ref 0.76–1.27)
Globulin, Total: 2.7 g/dL (ref 1.5–4.5)
Glucose: 92 mg/dL (ref 70–99)
Potassium: 4.2 mmol/L (ref 3.5–5.2)
Sodium: 142 mmol/L (ref 134–144)
Total Protein: 6.9 g/dL (ref 6.0–8.5)
eGFR: 94 mL/min/{1.73_m2} (ref >59)

## 2021-04-21 LAB — PULMONARY FUNCTION TEST
FEF 25-75 Post: 2.6 L/sec
FEF 25-75 Pre: 2.11 L/sec
FEF2575-%Change-Post: 23 %
FEF2575-%Pred-Post: 70 %
FEF2575-%Pred-Pre: 57 %
FEV1-%Change-Post: 6 %
FEV1-%Pred-Post: 86 %
FEV1-%Pred-Pre: 81 %
FEV1-Post: 3.03 L
FEV1-Pre: 2.83 L
FEV1FVC-%Change-Post: 3 %
FEV1FVC-%Pred-Pre: 88 %
FEV6-%Change-Post: 2 %
FEV6-%Pred-Post: 96 %
FEV6-%Pred-Pre: 93 %
FEV6-Post: 4.03 L
FEV6-Pre: 3.92 L
FEV6FVC-%Pred-Post: 101 %
FEV6FVC-%Pred-Pre: 101 %
FVC-%Change-Post: 2 %
FVC-%Pred-Post: 94 %
FVC-%Pred-Pre: 92 %
FVC-Post: 4.03 L
FVC-Pre: 3.92 L
Post FEV1/FVC ratio: 75 %
Post FEV6/FVC ratio: 100 %
Pre FEV1/FVC ratio: 72 %
Pre FEV6/FVC Ratio: 100 %

## 2021-04-21 LAB — HIV ANTIBODY (ROUTINE TESTING W REFLEX): HIV Screen 4th Generation wRfx: NONREACTIVE

## 2021-04-21 LAB — LIPID PANEL
Chol/HDL Ratio: 5.5 ratio — ABNORMAL HIGH (ref 0.0–5.0)
Cholesterol, Total: 226 mg/dL — ABNORMAL HIGH (ref 100–199)
HDL: 41 mg/dL (ref >39)
LDL Chol Calc (NIH): 159 mg/dL — ABNORMAL HIGH (ref 0–99)
Triglycerides: 140 mg/dL (ref 0–149)
VLDL Cholesterol Cal: 26 mg/dL (ref 5–40)

## 2021-04-21 LAB — HEMOGLOBIN A1C
Est. average glucose Bld gHb Est-mCnc: 117 mg/dL
Hgb A1c MFr Bld: 5.7 % — ABNORMAL HIGH (ref 4.8–5.6)

## 2021-04-21 LAB — TSH: TSH: 1.06 u[IU]/mL (ref 0.450–4.500)

## 2021-04-21 LAB — HEPATITIS C ANTIBODY: Hep C Virus Ab: NONREACTIVE

## 2021-04-21 NOTE — Progress Notes (Signed)
Spirometry pre and post done today. 

## 2021-04-28 ENCOUNTER — Other Ambulatory Visit: Payer: Self-pay

## 2021-04-28 ENCOUNTER — Ambulatory Visit (HOSPITAL_COMMUNITY)
Admission: RE | Admit: 2021-04-28 | Discharge: 2021-04-28 | Disposition: A | Discharge status: Home / Self Care | Payer: BC Managed Care – PPO | Source: Ambulatory Visit | Attending: Internal Medicine | Admitting: Internal Medicine

## 2021-04-28 DIAGNOSIS — D3A090 Benign carcinoid tumor of the bronchus and lung: Secondary | ICD-10-CM | POA: Insufficient documentation

## 2021-04-28 MED ORDER — IOHEXOL 300 MG/ML  SOLN
100.0000 mL | Freq: Once | INTRAMUSCULAR | Status: AC | PRN
  Administered 2021-04-28: 100 mL via INTRAVENOUS

## 2021-04-28 MED ORDER — SODIUM CHLORIDE (PF) 0.9 % IJ SOLN
INTRAMUSCULAR | Status: AC
  Filled 2021-04-28: qty 50

## 2021-05-03 ENCOUNTER — Encounter: Payer: Self-pay | Admitting: Internal Medicine

## 2021-05-03 ENCOUNTER — Ambulatory Visit: Payer: BC Managed Care – PPO | Admitting: Internal Medicine

## 2021-05-03 ENCOUNTER — Other Ambulatory Visit: Payer: Self-pay

## 2021-05-03 VITALS — BP 124/82 | HR 88 | Ht 69.75 in | Wt 245.0 lb

## 2021-05-03 DIAGNOSIS — C7A090 Malignant carcinoid tumor of the bronchus and lung: Secondary | ICD-10-CM | POA: Diagnosis not present

## 2021-05-03 DIAGNOSIS — J4540 Moderate persistent asthma, uncomplicated: Secondary | ICD-10-CM

## 2021-05-03 NOTE — Progress Notes (Signed)
? ?      ?Derek Fitzgerald    [id number]    17-May-1980 ? ?Primary Care Physician:Wilson, Clyde Canterbury, MD ?Date of Appointment: 05/03/2021 ?Established Patient Visit ? ?Chief complaint:   ?Chief Complaint  ?Patient presents with  ? Follow-up  ?  1 mo f/u for asthma and to discuss test results. States he has noticed that long walks have been an issue for him.   ? ? ? ?HPI: ?Derek Fitzgerald is a 41 y.o. man with moderate persistent asthma and recently diagnosed carcinoid tumor.  ? ?Interval Updates: ?Here for follow up after bronchoscopy which diagnosed endobronchial carcinoid tumor. Referral to thoracic surgery is pending.  ? ?He is taking albuterol inhaler which is about twice a day. He hasn't been consistently taking advair but it does help when he takes it.  ?No fevers chills, no weight loss.  ?Appetite is good.  ? ? ?Current Regimen: albuterol ?Asthma Triggers: exertion ?Exacerbations in the last year: 4 rounds in the ED  ?History of hospitalization or intubation: ?Allergy Testing: none ?GERD: denies ?Allergic Rhinitis: none ?ACT:  ?Asthma Control Test ACT Total Score  ?05/03/2021 18  ? ? ? ?I have reviewed the patient's family social and past medical history and updated as appropriate.  ? ?Past Medical History:  ?Diagnosis Date  ? Fatty liver   ? Hypercholesteremia   ? Lung cancer ([place])   ? ? ?Past Surgical History:  ?Procedure Laterality Date  ? BIOPSY  04/13/2021  ? Procedure: BIOPSY;  Surgeon: Spero Geralds, MD;  Location: [facility];  Service: Pulmonary;;  ? BRONCHIAL BRUSHINGS  04/13/2021  ? Procedure: BRONCHIAL BRUSHINGS;  Surgeon: Spero Geralds, MD;  Location: [facility];  Service: Pulmonary;;  ? BRONCHIAL WASHINGS  04/13/2021  ? Procedure: BRONCHIAL WASHINGS;  Surgeon: Spero Geralds, MD;  Location: [facility];  Service: Pulmonary;;  ? VIDEO BRONCHOSCOPY N/A 04/13/2021  ? Procedure: VIDEO BRONCHOSCOPY WITHOUT FLUORO;  Surgeon: Spero Geralds, MD;  Location: [facility];  Service: Pulmonary;  Laterality:  N/A;  ? ? ?Family History  ?Problem Relation Age of Onset  ? Prostate cancer Father   ? Asthma Son   ? ? ?Social History  ? ?Occupational History  ? Not on file  ?Tobacco Use  ? Smoking status: Never  ? Smokeless tobacco: Never  ?Vaping Use  ? Vaping Use: Never used  ?Substance and Sexual Activity  ? Alcohol use: Yes  ?  Comment: socially  ? Drug use: Never  ? Sexual activity: Not on file  ? ? ? ?Physical Exam: ?Blood pressure 124/82, pulse 88, height 5' 9.75" (1.772 m), weight 245 lb (111.1 kg), SpO2 96 %. ? ?Gen:      No acute distress ?ENT:  no nasal polyps, mucus membranes moist ?Lungs:    No increased respiratory effort, symmetric chest wall excursion, clear to auscultation bilaterally, no wheezes or crackles ?CV:         Regular rate and rhythm; no murmurs, rubs, or gallops.  No pedal edema ? ? ?Data Reviewed: ?Imaging: ?I have personally reviewed the CT Chest which shows lingular atelectasis persistent since October 2022 ? ?PFTs: ? ?PFT Results Latest Ref Rng & Units 04/21/2021  ?FVC-Pre L 3.92  ?FVC-Predicted Pre % 92  ?FVC-Post L 4.03  ?FVC-Predicted Post % 94  ?Pre FEV1/FVC % % 72  ?Post FEV1/FCV % % 75  ?FEV1-Pre L 2.83  ?FEV1-Predicted Pre % 81  ?FEV1-Post L 3.03  ? ?I have personally reviewed the patient's  PFTs and normal pulmonary function.  ? ?Labs: ? ?Immunization status: ? ?There is no immunization history on file for this patient. ? ?External Records Personally Reviewed:  ? ?Assessment:  ?Carcinoid tumor, LUL bronchus.  ?Moderate persistent asthma suboptimal control.  ? ?Plan/Recommendations: ?Start taking Advair 1 puff twice a day. ?Continue albuterol as needed.  ? ?Follow up with thoracic surgery regarding your carcinoid tumor, they will likely recommend surgical resection.  ? ?Return to Care: ?Return in about 3 months (around 08/03/2021). ? ? ?Lenice Llamas, MD ?Pulmonary and Critical Care Medicine ?[place] ?Office:[phone] ? ? ? ? ? ?

## 2021-05-03 NOTE — Patient Instructions (Signed)
Please schedule follow up scheduled with myself in 3 months.  If my schedule is not open yet, we will contact you with a reminder closer to that time. Please call [phone] if you haven't heard from [place] a month before.  ? ?Before your next visit I would like you to have: ?Consultation with thoracic surgery. ? ?Start taking your advair everyday! 1 puff in the morning, 1 puff at night. Gargle after use.  ? ?Continue albuterol as needed.  ? ?

## 2021-05-24 ENCOUNTER — Encounter: Payer: Self-pay | Admitting: Thoracic Surgery (Cardiothoracic Vascular Surgery)

## 2021-05-24 ENCOUNTER — Encounter: Payer: Self-pay | Admitting: *Deleted

## 2021-05-24 ENCOUNTER — Institutional Professional Consult (permissible substitution): Payer: BC Managed Care – PPO | Admitting: Thoracic Surgery (Cardiothoracic Vascular Surgery)

## 2021-05-24 ENCOUNTER — Other Ambulatory Visit: Payer: Self-pay

## 2021-05-24 DIAGNOSIS — R911 Solitary pulmonary nodule: Secondary | ICD-10-CM

## 2021-05-24 DIAGNOSIS — D3A090 Benign carcinoid tumor of the bronchus and lung: Secondary | ICD-10-CM

## 2021-05-24 NOTE — Progress Notes (Signed)
PCP is Dorna Mai, MD ?Referring Provider is Spero Geralds, MD ? ?Chief Complaint  ?Patient presents with  ? Lung Lesion  ?  New patient consultation, chest CT 04/03/21, Bronch 04/13/21, PFTS scheduled  ? ? ?HPI: [name] is sent for consultation regarding endobronchial carcinoid tumor. ? ?Derek Fitzgerald is a 41 year old man with a history of asthma and hyperlipidemia.  He had fairly regular episodes of bronchitis growing up.  Over the last 5 years he has had progressively more problems with bronchitis and wheezing.  In October 2022 he had worsening cough and shortness of breath along with wheezing.  He was diagnosed with pneumonia.  He had a chest x-ray which showed a lingular opacity.  He was treated with multiple rounds of steroids and antibiotics and given inhalers but his symptoms continued to progress. ? ?He saw Dr. Shearon Stalls.  A CT showed a nearly complete left upper lobe atelectasis with a probable endobronchial lesion.  He did bronchoscopy.  There was an obstructing mass lesion near the origin of the left upper lobe bronchus.  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  A CT of the abdomen and pelvis showed no other sites of disease. ? ?He works for [employer].  He has noticed some shortness of breath with exertion.  He continues to have cough although the cough and wheezing in both paradoxically improved.  No significant change in appetite or weight loss.  No chest pain, pressure, or tightness. ? ?Past Medical History:  ?Diagnosis Date  ? Fatty liver   ? Hypercholesteremia   ? Lung cancer ([place])   ? ? ?Past Surgical History:  ?Procedure Laterality Date  ? BIOPSY  04/13/2021  ? Procedure: BIOPSY;  Surgeon: Spero Geralds, MD;  Location: [facility];  Service: Pulmonary;;  ? BRONCHIAL BRUSHINGS  04/13/2021  ? Procedure: BRONCHIAL BRUSHINGS;  Surgeon: Spero Geralds, MD;  Location: [facility];  Service: Pulmonary;;  ? BRONCHIAL WASHINGS  04/13/2021  ? Procedure: BRONCHIAL WASHINGS;  Surgeon: Spero Geralds, MD;  Location: [facility];  Service: Pulmonary;;  ? VIDEO BRONCHOSCOPY N/A 04/13/2021  ? Procedure: VIDEO BRONCHOSCOPY WITHOUT FLUORO;  Surgeon: Spero Geralds, MD;  Location: [facility];  Service: Pulmonary;  Laterality: N/A;  ? ? ?Family History  ?Problem Relation Age of Onset  ? Prostate cancer Father   ? Asthma Son   ? ? ?Social History ?Social History  ? ?Tobacco Use  ? Smoking status: Never  ? Smokeless tobacco: Never  ?Vaping Use  ? Vaping Use: Never used  ?Substance Use Topics  ? Alcohol use: Yes  ?  Comment: socially  ? Drug use: Never  ? ? ?Current Outpatient Medications  ?Medication Sig Dispense Refill  ? albuterol (VENTOLIN HFA) 108 (90 Base) MCG/ACT inhaler Inhale 1-2 puffs into the lungs every 6 (six) hours as needed for wheezing or shortness of breath. (Patient taking differently: Inhale 1-2 puffs into the lungs every 4 (four) hours as needed for wheezing or shortness of breath.) 1 each 5  ? CLARITIN 10 MG tablet Take 10 mg by mouth daily as needed for allergies or rhinitis.    ? escitalopram (LEXAPRO) 10 MG tablet Take 10 mg by mouth daily.    ? FLONASE ALLERGY RELIEF 50 MCG/ACT nasal spray Place 1-2 sprays into both nostrils 2 (two) times daily as needed for allergies or rhinitis.    ? fluticasone-salmeterol (ADVAIR HFA) 230-21 MCG/ACT inhaler Inhale 2 puffs into the lungs 2 (two) times daily. 1 each  3  ? MUCINEX 600 MG 12 hr tablet Take 600 mg by mouth 2 (two) times daily as needed for to loosen phlegm or cough.    ? THERAFLU SEVERE COLD & COUGH Packet MISC Take 1 packet by mouth every 6 (six) hours as needed (for flu-like symptoms- mix into 6 ounces of water and drink).    ? ?No current facility-administered medications for this visit.  ? ? ?Allergies  ?Allergen Reactions  ? Other Swelling and Other (See Comments)  ?  Animal dander- Eyes swell  ? ?Tree Pollen-white Wendee Copp (betula Verrucosa)- tested allergic  ? ? ?Review of Systems  ?Constitutional:  Negative for activity change, fatigue and  unexpected weight change.  ?HENT:  Negative for trouble swallowing and voice change.   ?Respiratory:  Positive for cough, shortness of breath and wheezing.   ?Gastrointestinal:  Negative for abdominal distention and abdominal pain.  ?Genitourinary:  Negative for difficulty urinating and dysuria.  ?Neurological:  Negative for syncope and weakness.  ?Psychiatric/Behavioral:  Positive for dysphoric mood. The patient is nervous/anxious.   ?All other systems reviewed and are negative. ? ?BP 139/90 (BP Location: Left Arm, Patient Position: Sitting, Cuff Size: Normal)   Pulse 89   Resp 20   Ht 5' 9.75" (1.772 m)   Wt 247 lb 12.8 oz (112.4 kg)   SpO2 95% Comment: RA  BMI 35.81 kg/m?  ?Physical Exam ?Vitals reviewed.  ?Constitutional:   ?   General: He is not in acute distress. ?   Appearance: Normal appearance. He is obese.  ?HENT:  ?   Head: Normocephalic and atraumatic.  ?Eyes:  ?   General: No scleral icterus. ?   Extraocular Movements: Extraocular movements intact.  ?Cardiovascular:  ?   Rate and Rhythm: Normal rate and regular rhythm.  ?   Heart sounds: Normal heart sounds. No murmur heard. ?  No friction rub. No gallop.  ?Pulmonary:  ?   Effort: Pulmonary effort is normal. No respiratory distress.  ?   Breath sounds: Normal breath sounds. No wheezing.  ?Abdominal:  ?   General: There is no distension.  ?   Palpations: Abdomen is soft.  ?   Tenderness: There is no abdominal tenderness.  ?Musculoskeletal:  ?   Cervical back: Neck supple.  ?Lymphadenopathy:  ?   Cervical: No cervical adenopathy.  ?Skin: ?   General: Skin is warm and dry.  ?Neurological:  ?   General: No focal deficit present.  ?   Mental Status: He is alert and oriented to person, place, and time.  ?   Cranial Nerves: No cranial nerve deficit.  ?   Motor: No weakness.  ? ? ? ?Diagnostic Tests: ?CT CHEST WITH CONTRAST ?  ?TECHNIQUE: ?Multidetector CT imaging of the chest was performed during ?intravenous contrast administration. ?  ?RADIATION DOSE  REDUCTION: This exam was performed according to the ?departmental dose-optimization program which includes automated ?exposure control, adjustment of the mA and/or kV according to ?patient size and/or use of iterative reconstruction technique. ?  ?CONTRAST:  32mL OMNIPAQUE IOHEXOL 300 MG/ML  SOLN ?  ?COMPARISON:  04/03/2021. ?  ?FINDINGS: ?Cardiovascular: The heart is normal in size and there is no ?pericardial effusion. The aorta and pulmonary trunk are normal in ?caliber. ?  ?Mediastinum/Nodes: No mediastinal, hilar, or axillary ?lymphadenopathy. The thyroid gland, trachea, and esophagus are ?within normal limits. Mild pneumomediastinum. ?  ?Lungs/Pleura: The left upper lobe bronchus is occluded in there is ?left upper lobe collapse with reduced lung volume.  A trace ?pneumothorax is noted is noted anterior to the left upper lobe. The ?right lung is clear. No effusion. ?  ?Upper Abdomen: Fatty infiltration of the liver is noted. ?  ?Musculoskeletal: No chest wall abnormality. No acute or significant ?osseous findings. ?  ?IMPRESSION: ?1. Occlusion of the left upper lobe bronchus with left upper lobe ?collapse. The possibility of underlying pneumonia or neoplasm can ?not be excluded. Bronchoscopy may be beneficial for further ?evaluation. ?2. Trace left pneumothorax and pneumomediastinum. ?3. Hepatic steatosis. ?  ?Critical findings were given to Dr. Eulis Foster at 11:17 p.m. ?  ?  ?Electronically Signed ?  By: Brett Fairy M.D. ?  On: 04/03/2021 23:18 ?I personally reviewed the CT images.  There is a cut off in the proximal left upper lobe bronchus with left upper lobe atelectasis.  Compensatory hyperexpansion of right lung and left lower lobe with some degree of mediastinal shift to the left.  Small left pneumothorax. ? ?Pulmonary function testing ?FVC 3.92 (92%) ?FEV1 2.83 (81%) ?FEV1 3.03 (86%) postbronchodilator ? ?Impression: ?Derek Fitzgerald is a 41 year old non-smoker with a history of asthma and hyperlipidemia.  He  has been having significant wheezing issues really for the past 5 years.  Since October of last year he has had persistent cough and shortness of breath.  Work-up revealed an endobronchial mass lesion with

## 2021-05-24 NOTE — H&P (View-Only) (Signed)
PCP is Dorna Mai, MD ?Referring Provider is Spero Geralds, MD ? ?Chief Complaint  ?Patient presents with  ? Lung Lesion  ?  New patient consultation, chest CT 04/03/21, Bronch 04/13/21, PFTS scheduled  ? ? 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 ? ?Derek Fitzgerald is a 41 year old man with a history of asthma and hyperlipidemia.  He had fairly regular episodes of bronchitis growing up.  Over the last 5 years he has had progressively more problems with bronchitis and wheezing.  In October 2022 he had worsening cough and shortness of breath along with wheezing.  He was diagnosed with pneumonia.  He had a chest x-ray which showed a lingular opacity.  He was treated with multiple rounds of steroids and antibiotics and given inhalers but his symptoms continued to progress. ? ?He saw Dr. Shearon Stalls.  A CT showed a nearly complete left upper lobe atelectasis with a probable endobronchial lesion.  He did bronchoscopy.  There was an obstructing mass lesion near the origin of the left upper lobe bronchus.  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  A CT of the abdomen and pelvis showed no other sites of disease. ? ?He works for [employer].  He has noticed some shortness of breath with exertion.  He continues to have cough although the cough and wheezing in both paradoxically improved.  No significant change in appetite or weight loss.  No chest pain, pressure, or tightness. ? ?Past Medical History:  ?Diagnosis Date  ? Fatty liver   ? Hypercholesteremia   ? Lung cancer ([place])   ? ? ?Past Surgical History:  ?Procedure Laterality Date  ? BIOPSY  04/13/2021  ? Procedure: BIOPSY;  Surgeon: Spero Geralds, MD;  Location: [facility];  Service: Pulmonary;;  ? BRONCHIAL BRUSHINGS  04/13/2021  ? Procedure: BRONCHIAL BRUSHINGS;  Surgeon: Spero Geralds, MD;  Location: [facility];  Service: Pulmonary;;  ? BRONCHIAL WASHINGS  04/13/2021  ? Procedure: BRONCHIAL WASHINGS;  Surgeon: Spero Geralds, MD;  Location: [facility];  Service: Pulmonary;;  ? VIDEO BRONCHOSCOPY N/A 04/13/2021  ? Procedure: VIDEO BRONCHOSCOPY WITHOUT FLUORO;  Surgeon: Spero Geralds, MD;  Location: [facility];  Service: Pulmonary;  Laterality: N/A;  ? ? ?Family History  ?Problem Relation Age of Onset  ? Prostate cancer Father   ? Asthma Son   ? ? ?Social History ?Social History  ? ?Tobacco Use  ? Smoking status: Never  ? Smokeless tobacco: Never  ?Vaping Use  ? Vaping Use: Never used  ?Substance Use Topics  ? Alcohol use: Yes  ?  Comment: socially  ? Drug use: Never  ? ? ?Current Outpatient Medications  ?Medication Sig Dispense Refill  ? albuterol (VENTOLIN HFA) 108 (90 Base) MCG/ACT inhaler Inhale 1-2 puffs into the lungs every 6 (six) hours as needed for wheezing or shortness of breath. (Patient taking differently: Inhale 1-2 puffs into the lungs every 4 (four) hours as needed for wheezing or shortness of breath.) 1 each 5  ? CLARITIN 10 MG tablet Take 10 mg by mouth daily as needed for allergies or rhinitis.    ? escitalopram (LEXAPRO) 10 MG tablet Take 10 mg by mouth daily.    ? FLONASE ALLERGY RELIEF 50 MCG/ACT nasal spray Place 1-2 sprays into both nostrils 2 (two) times daily as needed for allergies or rhinitis.    ? fluticasone-salmeterol (ADVAIR HFA) 230-21 MCG/ACT inhaler Inhale 2 puffs into the lungs 2 (two) times daily. 1 each  3  ? MUCINEX 600 MG 12 hr tablet Take 600 mg by mouth 2 (two) times daily as needed for to loosen phlegm or cough.    ? THERAFLU SEVERE COLD & COUGH Packet MISC Take 1 packet by mouth every 6 (six) hours as needed (for flu-like symptoms- mix into 6 ounces of water and drink).    ? ?No current facility-administered medications for this visit.  ? ? ?Allergies  ?Allergen Reactions  ? Other Swelling and Other (See Comments)  ?  Animal dander- Eyes swell  ? ?Tree Pollen-white Derek Fitzgerald (betula Verrucosa)- tested allergic  ? ? ?Review of Systems  ?Constitutional:  Negative for activity change, fatigue and  unexpected weight change.  ?HENT:  Negative for trouble swallowing and voice change.   ?Respiratory:  Positive for cough, shortness of breath and wheezing.   ?Gastrointestinal:  Negative for abdominal distention and abdominal pain.  ?Genitourinary:  Negative for difficulty urinating and dysuria.  ?Neurological:  Negative for syncope and weakness.  ?Psychiatric/Behavioral:  Positive for dysphoric mood. The patient is nervous/anxious.   ?All other systems reviewed and are negative. ? ?BP 139/90 (BP Location: Left Arm, Patient Position: Sitting, Cuff Size: Normal)   Pulse 89   Resp 20   Ht 5' 9.75" (1.772 m)   Wt 247 lb 12.8 oz (112.4 kg)   SpO2 95% Comment: RA  BMI 35.81 kg/m?  ?Physical Exam ?Vitals reviewed.  ?Constitutional:   ?   General: He is not in acute distress. ?   Appearance: Normal appearance. He is obese.  ?HENT:  ?   Head: Normocephalic and atraumatic.  ?Eyes:  ?   General: No scleral icterus. ?   Extraocular Movements: Extraocular movements intact.  ?Cardiovascular:  ?   Rate and Rhythm: Normal rate and regular rhythm.  ?   Heart sounds: Normal heart sounds. No murmur heard. ?  No friction rub. No gallop.  ?Pulmonary:  ?   Effort: Pulmonary effort is normal. No respiratory distress.  ?   Breath sounds: Normal breath sounds. No wheezing.  ?Abdominal:  ?   General: There is no distension.  ?   Palpations: Abdomen is soft.  ?   Tenderness: There is no abdominal tenderness.  ?Musculoskeletal:  ?   Cervical back: Neck supple.  ?Lymphadenopathy:  ?   Cervical: No cervical adenopathy.  ?Skin: ?   General: Skin is warm and dry.  ?Neurological:  ?   General: No focal deficit present.  ?   Mental Status: He is alert and oriented to person, place, and time.  ?   Cranial Nerves: No cranial nerve deficit.  ?   Motor: No weakness.  ? ? ? ?Diagnostic Tests: ?CT CHEST WITH CONTRAST ?  ?TECHNIQUE: ?Multidetector CT imaging of the chest was performed during ?intravenous contrast administration. ?  ?RADIATION DOSE  REDUCTION: This exam was performed according to the ?departmental dose-optimization program which includes automated ?exposure control, adjustment of the mA and/or kV according to ?patient size and/or use of iterative reconstruction technique. ?  ?CONTRAST:  82mL OMNIPAQUE IOHEXOL 300 MG/ML  SOLN ?  ?COMPARISON:  04/03/2021. ?  ?FINDINGS: ?Cardiovascular: The heart is normal in size and there is no ?pericardial effusion. The aorta and pulmonary trunk are normal in ?caliber. ?  ?Mediastinum/Nodes: No mediastinal, hilar, or axillary ?lymphadenopathy. The thyroid gland, trachea, and esophagus are ?within normal limits. Mild pneumomediastinum. ?  ?Lungs/Pleura: The left upper lobe bronchus is occluded in there is ?left upper lobe collapse with reduced lung volume.  A trace ?pneumothorax is noted is noted anterior to the left upper lobe. The ?right lung is clear. No effusion. ?  ?Upper Abdomen: Fatty infiltration of the liver is noted. ?  ?Musculoskeletal: No chest wall abnormality. No acute or significant ?osseous findings. ?  ?IMPRESSION: ?1. Occlusion of the left upper lobe bronchus with left upper lobe ?collapse. The possibility of underlying pneumonia or neoplasm can ?not be excluded. Bronchoscopy may be beneficial for further ?evaluation. ?2. Trace left pneumothorax and pneumomediastinum. ?3. Hepatic steatosis. ?  ?Critical findings were given to Dr. Eulis Fitzgerald at 11:17 p.m. ?  ?  ?Electronically Signed ?  By: Derek Fitzgerald M.D. ?  On: 04/03/2021 23:18 ?I personally reviewed the CT images.  There is a cut off in the proximal left upper lobe bronchus with left upper lobe atelectasis.  Compensatory hyperexpansion of right lung and left lower lobe with some degree of mediastinal shift to the left.  Small left pneumothorax. ? ?Pulmonary function testing ?FVC 3.92 (92%) ?FEV1 2.83 (81%) ?FEV1 3.03 (86%) postbronchodilator ? ?Impression: ?Derek Fitzgerald is a 41 year old non-smoker with a history of asthma and hyperlipidemia.  He  has been having significant wheezing issues really for the past 5 years.  Since October of last year he has had persistent cough and shortness of breath.  Work-up revealed an endobronchial mass lesion with

## 2021-06-08 NOTE — Progress Notes (Signed)
Surgical Instructions ? ? ? Your procedure is scheduled on Monday, April 17th. ? Report to [facility] Main Entrance "A" at 5:30 A.M., then check in with the Admitting office. ? Call this number if you have problems the morning of surgery: ? [phone] ? ? If you have any questions prior to your surgery date call [phone]: Open Monday-Friday 8am-4pm ? ? ? Remember: ? Do not eat or drink after midnight the night before your surgery ? ? ? Take these medicines the morning of surgery with A SIP OF WATER:  ? Escitalopram (Lexapro) ? Advair Inhaler ? ? If needed: ? Tylenol ? Albuterol inhaler - bring with you on day of surgery ? Flonase Nasal Spray ? ?As of today, STOP taking any Aspirin (unless otherwise instructed by your surgeon) Aleve, Naproxen, Ibuprofen, Motrin, Advil, Goody's, BC's, all herbal medications, fish oil, and all vitamins. ? ?         DAY OF SURGERY: ?Do not wear jewelry  ?Do not wear lotions, powders, colognes, or deodorant. ?Men may shave face and neck. ?Do not bring valuables to the hospital. ? ?[facility] is not responsible for any belongings or valuables. .  ? ?Do NOT Smoke (Tobacco/Vaping)  24 hours prior to your procedure ? ?If you use a CPAP at night, you may bring your mask for your overnight stay. ?  ?Contacts, glasses, hearing aids, dentures or partials may not be worn into surgery, please bring cases for these belongings ?  ?For patients admitted to the hospital, discharge time will be determined by your treatment team. ?  ?Patients discharged the day of surgery will not be allowed to drive home, and someone needs to stay with them for 24 hours. ? ? ?SURGICAL WAITING ROOM VISITATION ?Patients having surgery or a procedure in a hospital may have two support people. ?Children under the age of 29 must have an adult with them who is not the patient. ?They may stay in the waiting area during the procedure and may switch out with other visitors. If the patient needs to stay at the hospital  during part of their recovery, the visitor guidelines for inpatient rooms apply. ? ?Please refer to the [facility] website for the visitor guidelines for Inpatients (after your surgery is over and you are in a regular room).  ? ? ?Special instructions:   ? ?Oral Hygiene is also important to reduce your risk of infection.  Remember - BRUSH YOUR TEETH THE MORNING OF SURGERY WITH YOUR REGULAR TOOTHPASTE ? ? ?- Preparing For Surgery ? ?Before surgery, you can play an important role. Because skin is not sterile, your skin needs to be as free of germs as possible. You can reduce the number of germs on your skin by washing with CHG (chlorahexidine gluconate) Soap before surgery.  CHG is an antiseptic cleaner which kills germs and bonds with the skin to continue killing germs even after washing.   ? ? ?Please do not use if you have an allergy to CHG or antibacterial soaps. If your skin becomes reddened/irritated stop using the CHG.  ?Do not shave (including legs and underarms) for at least 48 hours prior to first CHG shower. It is OK to shave your face. ? ?Please follow these instructions carefully. ?  ? ? Shower the NIGHT BEFORE SURGERY and the MORNING OF SURGERY with CHG Soap.  ? If you chose to wash your hair, wash your hair first as usual with your normal shampoo. After you shampoo, rinse your  hair and body thoroughly to remove the shampoo.  Then [employer] and genitals (private parts) with your normal soap and rinse thoroughly to remove soap. ? ?After that Use CHG Soap as you would any other liquid soap. You can apply CHG directly to the skin and wash gently with a scrungie or a clean washcloth.  ? ?Apply the CHG Soap to your body ONLY FROM THE NECK DOWN.  Do not use on open wounds or open sores. Avoid contact with your eyes, ears, mouth and genitals (private parts). Wash Face and genitals (private parts)  with your normal soap.  ? ?Wash thoroughly, paying special attention to the area where your surgery will  be performed. ? ?Thoroughly rinse your body with warm water from the neck down. ? ?DO NOT shower/wash with your normal soap after using and rinsing off the CHG Soap. ? ?Pat yourself dry with a CLEAN TOWEL. ? ?Wear CLEAN PAJAMAS to bed the night before surgery ? ?Place CLEAN SHEETS on your bed the night before your surgery ? ?DO NOT SLEEP WITH PETS. ? ? ?Day of Surgery: ? ?Take a shower with CHG soap. ?Wear Clean/Comfortable clothing the morning of surgery ?Do not apply any deodorants/lotions.   ?Remember to brush your teeth WITH YOUR REGULAR TOOTHPASTE. ? ?If you received a COVID test during your pre-op visit  it is requested that you wear a mask when out in public, stay away from anyone that may not be feeling well and notify your surgeon if you develop symptoms. If you have been in contact with anyone that has tested positive in the last 10 days please notify you surgeon. ? ?  ?Please read over the following fact sheets that you were given.  ? ?

## 2021-06-09 ENCOUNTER — Encounter (HOSPITAL_COMMUNITY)
Admission: RE | Admit: 2021-06-09 | Discharge: 2021-06-09 | Disposition: A | Discharge status: Home / Self Care | Payer: BC Managed Care – PPO | Source: Ambulatory Visit | Attending: Thoracic Surgery (Cardiothoracic Vascular Surgery) | Admitting: Thoracic Surgery (Cardiothoracic Vascular Surgery)

## 2021-06-09 ENCOUNTER — Other Ambulatory Visit: Payer: Self-pay

## 2021-06-09 ENCOUNTER — Encounter (HOSPITAL_COMMUNITY): Payer: Self-pay

## 2021-06-09 VITALS — BP 147/97 | HR 100 | Temp 98.2°F | Resp 18 | Ht 69.0 in | Wt 240.3 lb

## 2021-06-09 DIAGNOSIS — Z01818 Encounter for other preprocedural examination: Secondary | ICD-10-CM | POA: Diagnosis not present

## 2021-06-09 DIAGNOSIS — D3A090 Benign carcinoid tumor of the bronchus and lung: Secondary | ICD-10-CM | POA: Diagnosis not present

## 2021-06-09 DIAGNOSIS — Z20822 Contact with and (suspected) exposure to covid-19: Secondary | ICD-10-CM | POA: Diagnosis not present

## 2021-06-09 HISTORY — DX: Personal history of urinary calculi: Z87.442

## 2021-06-09 HISTORY — DX: Malignant (primary) neoplasm, unspecified: C80.1

## 2021-06-09 HISTORY — DX: Depression, unspecified: F32.A

## 2021-06-09 HISTORY — DX: Unspecified asthma, uncomplicated: J45.909

## 2021-06-09 HISTORY — DX: Anxiety disorder, unspecified: F41.9

## 2021-06-09 LAB — CBC
HCT: 50.6 % (ref 39.0–52.0)
Hemoglobin: 17.3 g/dL — ABNORMAL HIGH (ref 13.0–17.0)
MCH: 30.5 pg (ref 26.0–34.0)
MCHC: 34.2 g/dL (ref 30.0–36.0)
MCV: 89.1 fL (ref 80.0–100.0)
Platelets: UNDETERMINED K/uL (ref 150–400)
RBC: 5.68 MIL/uL (ref 4.22–5.81)
RDW: 12.3 % (ref 11.5–15.5)
WBC: 7.4 K/uL (ref 4.0–10.5)
nRBC: 0 % (ref 0.0–0.2)

## 2021-06-09 LAB — BLOOD GAS, ARTERIAL
Acid-base deficit: 0.5 mmol/L (ref 0.0–2.0)
Bicarbonate: 23.1 mmol/L (ref 20.0–28.0)
O2 Saturation: 98 %
Patient temperature: 37
pCO2 arterial: 34 mmHg (ref 32–48)
pH, Arterial: 7.44 (ref 7.35–7.45)
pO2, Arterial: 88 mmHg (ref 83–108)

## 2021-06-09 LAB — URINALYSIS, ROUTINE W REFLEX MICROSCOPIC
Bilirubin Urine: NEGATIVE
Glucose, UA: NEGATIVE mg/dL
Hgb urine dipstick: NEGATIVE
Ketones, ur: NEGATIVE mg/dL
Leukocytes,Ua: NEGATIVE
Nitrite: NEGATIVE
Protein, ur: NEGATIVE mg/dL
Specific Gravity, Urine: 1.021 (ref 1.005–1.030)
pH: 7 (ref 5.0–8.0)

## 2021-06-09 LAB — COMPREHENSIVE METABOLIC PANEL WITH GFR
ALT: 155 U/L — ABNORMAL HIGH (ref 0–44)
AST: 60 U/L — ABNORMAL HIGH (ref 15–41)
Albumin: 4.2 g/dL (ref 3.5–5.0)
Alkaline Phosphatase: 70 U/L (ref 38–126)
Anion gap: 7 (ref 5–15)
BUN: 10 mg/dL (ref 6–20)
CO2: 20 mmol/L — ABNORMAL LOW (ref 22–32)
Calcium: 9.2 mg/dL (ref 8.9–10.3)
Chloride: 112 mmol/L — ABNORMAL HIGH (ref 98–111)
Creatinine, Ser: 0.96 mg/dL (ref 0.61–1.24)
GFR, Estimated: >60 mL/min
Glucose, Bld: 89 mg/dL (ref 70–99)
Potassium: 4.3 mmol/L (ref 3.5–5.1)
Sodium: 139 mmol/L (ref 135–145)
Total Bilirubin: 1.6 mg/dL — ABNORMAL HIGH (ref 0.3–1.2)
Total Protein: 7.1 g/dL (ref 6.5–8.1)

## 2021-06-09 LAB — TYPE AND SCREEN
ABO/RH(D): A POS
Antibody Screen: NEGATIVE

## 2021-06-09 LAB — APTT: aPTT: 30 seconds (ref 24–36)

## 2021-06-09 LAB — PROTIME-INR
INR: 1 (ref 0.8–1.2)
Prothrombin Time: 13.2 seconds (ref 11.4–15.2)

## 2021-06-09 LAB — SURGICAL PCR SCREEN
MRSA, PCR: NEGATIVE
Staphylococcus aureus: NEGATIVE

## 2021-06-09 NOTE — Telephone Encounter (Signed)
completed

## 2021-06-09 NOTE — Progress Notes (Signed)
PCP - Dorna Mai MD ?Cardiologist - none ?Pulmonologist-Nikita Shearon Stalls MD ? ?PPM/ICD - denies ?Device Orders -  ?Rep Notified -  ? ?Chest x-ray - 06/09/21 ?EKG - 06/09/21 ?Stress Test - none ?ECHO - none ?Cardiac Cath - none ? ?Sleep Study - none ?CPAP -  ? ?Fasting Blood Sugar -na  ?Checks Blood Sugar _____ times a day ? ?Blood Thinner Instructions:na ?Aspirin Instructions:na ? ?ERAS Protcol -no ?PRE-SURGERY Ensure or G2-  ? ?COVID TEST- yes 06/09/21 ? ? ?Anesthesia review: no ? ?Patient denies shortness of breath, fever, cough and chest pain at PAT appointment ? ? ?All instructions explained to the patient, with a verbal understanding of the material. Patient agrees to go over the instructions while at home for a better understanding. Patient also instructed to wear a mask when out in public prior to surgery. The opportunity to ask questions was provided. ?  ?

## 2021-06-10 LAB — SARS CORONAVIRUS 2 (TAT 6-24 HRS): SARS Coronavirus 2: NEGATIVE

## 2021-06-12 NOTE — Anesthesia Preprocedure Evaluation (Addendum)
Anesthesia Evaluation  ?Patient identified by MRN, date of birth, ID band ?Patient awake ? ? ? ?Reviewed: ?Allergy & Precautions, NPO status , Patient's Chart, lab work & pertinent test results ? ?History of Anesthesia Complications ?Negative for: history of anesthetic complications ? ?Airway ?Mallampati: II ? ?TM Distance: >3 FB ?Neck ROM: Full ? ? ? Dental ? ?(+) Dental Advisory Given, Teeth Intact ?  ?Pulmonary ?asthma ,  ? ?Carcinoid tumor of LUL bronchus ? ?  ?Pulmonary exam normal ? ? ? ? ? ? ? Cardiovascular ?negative cardio ROS ?Normal cardiovascular exam ? ? ?  ?Neuro/Psych ?PSYCHIATRIC DISORDERS Anxiety Depression negative neurological ROS ?   ? GI/Hepatic ?negative GI ROS, Neg liver ROS,   ?Endo/Other  ? ?Obesity ? ? Renal/GU ?negative Renal ROS  ? ?  ?Musculoskeletal ?negative musculoskeletal ROS ?(+)  ? Abdominal ?  ?Peds ? Hematology ?negative hematology ROS ?(+)   ?Anesthesia Other Findings ? ? Reproductive/Obstetrics ? ?  ? ? ? ? ? ? ? ? ? ? ? ? ? ?  ?  ? ? ? ? ? ? ? ?Anesthesia Physical ?Anesthesia Plan ? ?ASA: 3 ? ?Anesthesia Plan: General  ? ?Post-op Pain Management: Tylenol PO (pre-op)*  ? ?Induction: Intravenous ? ?PONV Risk Score and Plan: 2 and Treatment may vary due to age or medical condition, Ondansetron, Dexamethasone and Midazolam ? ?Airway Management Planned: Double Lumen EBT and Oral ETT ? ?Additional Equipment: Arterial line, CVP and Ultrasound Guidance Line Placement ? ?Intra-op Plan:  ? ?Post-operative Plan: Possible Post-op intubation/ventilation ? ?Informed Consent: I have reviewed the patients History and Physical, chart, labs and discussed the procedure including the risks, benefits and alternatives for the proposed anesthesia with the patient or authorized representative who has indicated his/her understanding and acceptance.  ? ? ? ?Dental advisory given ? ?Plan Discussed with: CRNA, Anesthesiologist and Surgeon ? ?Anesthesia Plan Comments:  (Start with single lumen ETT for bronch, switch to DLEBT afterwards for surgery)  ? ? ? ? ? ?Anesthesia Quick Evaluation ? ?

## 2021-06-13 ENCOUNTER — Encounter (HOSPITAL_COMMUNITY): Payer: Self-pay | Admitting: Thoracic Surgery (Cardiothoracic Vascular Surgery)

## 2021-06-13 ENCOUNTER — Other Ambulatory Visit: Payer: Self-pay

## 2021-06-13 ENCOUNTER — Encounter (HOSPITAL_COMMUNITY)
Admission: RE | Disposition: A | Discharge status: Home / Self Care | Payer: Self-pay | Source: Home / Self Care | Attending: Thoracic Surgery (Cardiothoracic Vascular Surgery)

## 2021-06-13 ENCOUNTER — Inpatient Hospital Stay (HOSPITAL_COMMUNITY): Payer: BC Managed Care – PPO | Admitting: Anesthesiology

## 2021-06-13 ENCOUNTER — Inpatient Hospital Stay (HOSPITAL_COMMUNITY): Payer: BC Managed Care – PPO

## 2021-06-13 ENCOUNTER — Inpatient Hospital Stay (HOSPITAL_COMMUNITY): Payer: BC Managed Care – PPO | Admitting: Physician Assistant

## 2021-06-13 ENCOUNTER — Inpatient Hospital Stay (HOSPITAL_COMMUNITY)
Admission: RE | Admit: 2021-06-13 | Discharge: 2021-06-16 | DRG: 164 | Disposition: A | Discharge status: Home / Self Care | Payer: BC Managed Care – PPO | Attending: Thoracic Surgery (Cardiothoracic Vascular Surgery) | Admitting: Thoracic Surgery (Cardiothoracic Vascular Surgery)

## 2021-06-13 DIAGNOSIS — J45909 Unspecified asthma, uncomplicated: Secondary | ICD-10-CM | POA: Diagnosis present

## 2021-06-13 DIAGNOSIS — E7800 Pure hypercholesterolemia, unspecified: Secondary | ICD-10-CM | POA: Diagnosis present

## 2021-06-13 DIAGNOSIS — Z7951 Long term (current) use of inhaled steroids: Secondary | ICD-10-CM | POA: Diagnosis not present

## 2021-06-13 DIAGNOSIS — Z8616 Personal history of COVID-19: Secondary | ICD-10-CM

## 2021-06-13 DIAGNOSIS — K760 Fatty (change of) liver, not elsewhere classified: Secondary | ICD-10-CM | POA: Diagnosis present

## 2021-06-13 DIAGNOSIS — Z825 Family history of asthma and other chronic lower respiratory diseases: Secondary | ICD-10-CM | POA: Diagnosis not present

## 2021-06-13 DIAGNOSIS — D3A090 Benign carcinoid tumor of the bronchus and lung: Principal | ICD-10-CM | POA: Diagnosis present

## 2021-06-13 DIAGNOSIS — J9811 Atelectasis: Secondary | ICD-10-CM | POA: Diagnosis present

## 2021-06-13 DIAGNOSIS — Z8042 Family history of malignant neoplasm of prostate: Secondary | ICD-10-CM

## 2021-06-13 DIAGNOSIS — J9382 Other air leak: Secondary | ICD-10-CM | POA: Diagnosis not present

## 2021-06-13 DIAGNOSIS — E876 Hypokalemia: Secondary | ICD-10-CM | POA: Diagnosis present

## 2021-06-13 DIAGNOSIS — Z902 Acquired absence of lung [part of]: Principal | ICD-10-CM

## 2021-06-13 DIAGNOSIS — Z85118 Personal history of other malignant neoplasm of bronchus and lung: Secondary | ICD-10-CM

## 2021-06-13 DIAGNOSIS — Z6835 Body mass index (BMI) 35.0-35.9, adult: Secondary | ICD-10-CM

## 2021-06-13 DIAGNOSIS — I10 Essential (primary) hypertension: Secondary | ICD-10-CM | POA: Diagnosis present

## 2021-06-13 DIAGNOSIS — Z79899 Other long term (current) drug therapy: Secondary | ICD-10-CM

## 2021-06-13 DIAGNOSIS — E669 Obesity, unspecified: Secondary | ICD-10-CM | POA: Diagnosis present

## 2021-06-13 DIAGNOSIS — C3412 Malignant neoplasm of upper lobe, left bronchus or lung: Secondary | ICD-10-CM | POA: Diagnosis not present

## 2021-06-13 HISTORY — PX: NODE DISSECTION: SHX5269

## 2021-06-13 HISTORY — PX: VIDEO BRONCHOSCOPY: SHX5072

## 2021-06-13 HISTORY — PX: INTERCOSTAL NERVE BLOCK: SHX5021

## 2021-06-13 HISTORY — PX: LASER BRONCHOSCOPY: SHX6534

## 2021-06-13 LAB — ABO/RH: ABO/RH(D): A POS

## 2021-06-13 SURGERY — LOBECTOMY, LUNG, ROBOT-ASSISTED, USING VATS
Anesthesia: General | Site: Chest

## 2021-06-13 MED ORDER — SUGAMMADEX SODIUM 200 MG/2ML IV SOLN
INTRAVENOUS | Status: DC | PRN
  Administered 2021-06-13: 200 mg via INTRAVENOUS

## 2021-06-13 MED ORDER — NITROGLYCERIN 0.4 MG SL SUBL
SUBLINGUAL_TABLET | SUBLINGUAL | Status: AC
  Filled 2021-06-13: qty 1

## 2021-06-13 MED ORDER — FENTANYL CITRATE (PF) 100 MCG/2ML IJ SOLN
INTRAMUSCULAR | Status: DC | PRN
  Administered 2021-06-13: 100 ug via INTRAVENOUS

## 2021-06-13 MED ORDER — HYDROMORPHONE HCL 1 MG/ML IJ SOLN
0.2500 mg | INTRAMUSCULAR | Status: DC | PRN
  Administered 2021-06-13 (×4): 0.5 mg via INTRAVENOUS

## 2021-06-13 MED ORDER — ROCURONIUM BROMIDE 10 MG/ML (PF) SYRINGE
PREFILLED_SYRINGE | INTRAVENOUS | Status: DC | PRN
  Administered 2021-06-13 (×2): 20 mg via INTRAVENOUS
  Administered 2021-06-13: 70 mg via INTRAVENOUS
  Administered 2021-06-13: 20 mg via INTRAVENOUS
  Administered 2021-06-13: 10 mg via INTRAVENOUS

## 2021-06-13 MED ORDER — KETOROLAC TROMETHAMINE 15 MG/ML IJ SOLN
INTRAMUSCULAR | Status: AC
  Filled 2021-06-13: qty 1

## 2021-06-13 MED ORDER — KETOROLAC TROMETHAMINE 15 MG/ML IJ SOLN
15.0000 mg | Freq: Four times a day (QID) | INTRAMUSCULAR | Status: DC
  Administered 2021-06-13 – 2021-06-14 (×4): 15 mg via INTRAVENOUS
  Filled 2021-06-13 (×3): qty 1

## 2021-06-13 MED ORDER — EPINEPHRINE PF 1 MG/ML IJ SOLN
INTRAMUSCULAR | Status: DC | PRN
  Administered 2021-06-13: 3 mg

## 2021-06-13 MED ORDER — FENTANYL CITRATE (PF) 100 MCG/2ML IJ SOLN
INTRAMUSCULAR | Status: AC
  Filled 2021-06-13: qty 2

## 2021-06-13 MED ORDER — HYDROMORPHONE HCL 1 MG/ML IJ SOLN
INTRAMUSCULAR | Status: AC
  Filled 2021-06-13: qty 0.5

## 2021-06-13 MED ORDER — MIDAZOLAM HCL 2 MG/2ML IJ SOLN
INTRAMUSCULAR | Status: DC | PRN
  Administered 2021-06-13: 2 mg via INTRAVENOUS

## 2021-06-13 MED ORDER — HYDROMORPHONE HCL 1 MG/ML IJ SOLN
INTRAMUSCULAR | Status: AC
  Filled 2021-06-13: qty 1

## 2021-06-13 MED ORDER — ONDANSETRON HCL 4 MG/2ML IJ SOLN
4.0000 mg | Freq: Once | INTRAMUSCULAR | Status: AC | PRN
  Administered 2021-06-13: 4 mg via INTRAVENOUS

## 2021-06-13 MED ORDER — ENOXAPARIN SODIUM 40 MG/0.4ML IJ SOSY
40.0000 mg | PREFILLED_SYRINGE | Freq: Every day | INTRAMUSCULAR | Status: DC
  Administered 2021-06-14 – 2021-06-15 (×2): 40 mg via SUBCUTANEOUS
  Filled 2021-06-13 (×2): qty 0.4

## 2021-06-13 MED ORDER — BUPIVACAINE LIPOSOME 1.3 % IJ SUSP
INTRAMUSCULAR | Status: AC
  Filled 2021-06-13: qty 20

## 2021-06-13 MED ORDER — EPINEPHRINE PF 1 MG/ML IJ SOLN
INTRAMUSCULAR | Status: AC
  Filled 2021-06-13: qty 2

## 2021-06-13 MED ORDER — SODIUM CHLORIDE 0.9 % IV SOLN: INTRAVENOUS | Status: DC

## 2021-06-13 MED ORDER — DIPHENHYDRAMINE HCL 50 MG/ML IJ SOLN
INTRAMUSCULAR | Status: DC | PRN
  Administered 2021-06-13: 12.5 mg via INTRAVENOUS

## 2021-06-13 MED ORDER — CHLORHEXIDINE GLUCONATE 0.12 % MT SOLN: 15.0000 mL | Freq: Once | OROMUCOSAL | Status: AC

## 2021-06-13 MED ORDER — CEFAZOLIN SODIUM-DEXTROSE 2-4 GM/100ML-% IV SOLN
2.0000 g | Freq: Three times a day (TID) | INTRAVENOUS | Status: AC
  Administered 2021-06-13 (×2): 2 g via INTRAVENOUS
  Filled 2021-06-13 (×2): qty 100

## 2021-06-13 MED ORDER — AMISULPRIDE (ANTIEMETIC) 5 MG/2ML IV SOLN
INTRAVENOUS | Status: AC
  Filled 2021-06-13: qty 4

## 2021-06-13 MED ORDER — MIDAZOLAM HCL 2 MG/2ML IJ SOLN
INTRAMUSCULAR | Status: AC
  Filled 2021-06-13: qty 2

## 2021-06-13 MED ORDER — LACTATED RINGERS IV SOLN: INTRAVENOUS | Status: DC

## 2021-06-13 MED ORDER — 0.9 % SODIUM CHLORIDE (POUR BTL) OPTIME
TOPICAL | Status: DC | PRN
  Administered 2021-06-13: 2000 mL

## 2021-06-13 MED ORDER — ONDANSETRON HCL 4 MG/2ML IJ SOLN
INTRAMUSCULAR | Status: AC
  Filled 2021-06-13: qty 2

## 2021-06-13 MED ORDER — PHENYLEPHRINE 40 MCG/ML (10ML) SYRINGE FOR IV PUSH (FOR BLOOD PRESSURE SUPPORT)
PREFILLED_SYRINGE | INTRAVENOUS | Status: DC | PRN
  Administered 2021-06-13: 80 ug via INTRAVENOUS
  Administered 2021-06-13: 40 ug via INTRAVENOUS

## 2021-06-13 MED ORDER — ACETAMINOPHEN 500 MG PO TABS
1000.0000 mg | ORAL_TABLET | Freq: Once | ORAL | Status: AC
  Administered 2021-06-13: 1000 mg via ORAL
  Filled 2021-06-13: qty 2

## 2021-06-13 MED ORDER — FENTANYL CITRATE (PF) 250 MCG/5ML IJ SOLN
INTRAMUSCULAR | Status: AC
  Filled 2021-06-13: qty 5

## 2021-06-13 MED ORDER — ONDANSETRON HCL 4 MG/2ML IJ SOLN: 4.0000 mg | Freq: Four times a day (QID) | INTRAMUSCULAR | Status: DC | PRN

## 2021-06-13 MED ORDER — SENNOSIDES-DOCUSATE SODIUM 8.6-50 MG PO TABS
1.0000 | ORAL_TABLET | Freq: Every day | ORAL | Status: DC
  Administered 2021-06-13 – 2021-06-15 (×3): 1 via ORAL
  Filled 2021-06-13 (×3): qty 1

## 2021-06-13 MED ORDER — ACETAMINOPHEN 500 MG PO TABS
1000.0000 mg | ORAL_TABLET | Freq: Four times a day (QID) | ORAL | Status: DC
  Administered 2021-06-13 – 2021-06-16 (×9): 1000 mg via ORAL
  Filled 2021-06-13 (×10): qty 2

## 2021-06-13 MED ORDER — FENTANYL CITRATE PF 50 MCG/ML IJ SOSY
25.0000 ug | PREFILLED_SYRINGE | INTRAMUSCULAR | Status: DC | PRN
  Administered 2021-06-13: 50 ug via INTRAVENOUS
  Filled 2021-06-13: qty 1

## 2021-06-13 MED ORDER — PROPOFOL 10 MG/ML IV BOLUS
INTRAVENOUS | Status: AC
  Filled 2021-06-13: qty 20

## 2021-06-13 MED ORDER — LACTATED RINGERS IV SOLN: INTRAVENOUS | Status: DC | PRN

## 2021-06-13 MED ORDER — PHENYLEPHRINE HCL-NACL 20-0.9 MG/250ML-% IV SOLN
INTRAVENOUS | Status: DC | PRN
  Administered 2021-06-13: 50 ug/min via INTRAVENOUS

## 2021-06-13 MED ORDER — CEFAZOLIN SODIUM-DEXTROSE 2-4 GM/100ML-% IV SOLN
2.0000 g | INTRAVENOUS | Status: AC
  Administered 2021-06-13: 2 g via INTRAVENOUS
  Filled 2021-06-13: qty 100

## 2021-06-13 MED ORDER — CHLORHEXIDINE GLUCONATE 0.12 % MT SOLN
OROMUCOSAL | Status: AC
Start: 2021-06-13 — End: 2021-06-13
  Administered 2021-06-13: 15 mL via OROMUCOSAL
  Filled 2021-06-13: qty 15

## 2021-06-13 MED ORDER — SODIUM CHLORIDE FLUSH 0.9 % IV SOLN
INTRAVENOUS | Status: DC | PRN
  Administered 2021-06-13: 85 mL

## 2021-06-13 MED ORDER — FENTANYL CITRATE (PF) 100 MCG/2ML IJ SOLN
INTRAMUSCULAR | Status: DC | PRN
  Administered 2021-06-13: 100 ug via INTRAVENOUS
  Administered 2021-06-13: 50 ug via INTRAVENOUS

## 2021-06-13 MED ORDER — ACETAMINOPHEN 160 MG/5ML PO SOLN: 1000.0000 mg | Freq: Four times a day (QID) | ORAL | Status: DC

## 2021-06-13 MED ORDER — AMISULPRIDE (ANTIEMETIC) 5 MG/2ML IV SOLN
10.0000 mg | Freq: Once | INTRAVENOUS | Status: AC
  Administered 2021-06-13: 10 mg via INTRAVENOUS

## 2021-06-13 MED ORDER — ESCITALOPRAM OXALATE 10 MG PO TABS
10.0000 mg | ORAL_TABLET | Freq: Every day | ORAL | Status: DC
  Administered 2021-06-14 – 2021-06-16 (×3): 10 mg via ORAL
  Filled 2021-06-13 (×3): qty 1

## 2021-06-13 MED ORDER — ALBUTEROL SULFATE (2.5 MG/3ML) 0.083% IN NEBU: 2.5000 mg | INHALATION_SOLUTION | RESPIRATORY_TRACT | Status: DC

## 2021-06-13 MED ORDER — DEXTROSE-NACL 5-0.9 % IV SOLN: INTRAVENOUS | Status: DC

## 2021-06-13 MED ORDER — OXYCODONE HCL 5 MG PO TABS
5.0000 mg | ORAL_TABLET | ORAL | Status: DC | PRN
  Administered 2021-06-14: 10 mg via ORAL
  Filled 2021-06-13: qty 2

## 2021-06-13 MED ORDER — TRAMADOL HCL 50 MG PO TABS: 50.0000 mg | ORAL_TABLET | Freq: Four times a day (QID) | ORAL | Status: DC | PRN

## 2021-06-13 MED ORDER — NITROGLYCERIN 0.4 MG SL SUBL
0.4000 mg | SUBLINGUAL_TABLET | SUBLINGUAL | Status: AC | PRN
  Administered 2021-06-13 – 2021-06-15 (×3): 0.4 mg via SUBLINGUAL
  Filled 2021-06-13: qty 1

## 2021-06-13 MED ORDER — MOMETASONE FURO-FORMOTEROL FUM 200-5 MCG/ACT IN AERO
2.0000 | INHALATION_SPRAY | Freq: Two times a day (BID) | RESPIRATORY_TRACT | Status: DC
  Administered 2021-06-14 – 2021-06-15 (×3): 2 via RESPIRATORY_TRACT
  Filled 2021-06-13: qty 8.8

## 2021-06-13 MED ORDER — BUPIVACAINE HCL (PF) 0.5 % IJ SOLN
INTRAMUSCULAR | Status: AC
  Filled 2021-06-13: qty 30

## 2021-06-13 MED ORDER — BISACODYL 5 MG PO TBEC
10.0000 mg | DELAYED_RELEASE_TABLET | Freq: Every day | ORAL | Status: DC
  Administered 2021-06-15: 10 mg via ORAL
  Filled 2021-06-13 (×2): qty 2

## 2021-06-13 MED ORDER — OXYCODONE HCL 5 MG/5ML PO SOLN: 5.0000 mg | Freq: Once | ORAL | Status: DC | PRN

## 2021-06-13 MED ORDER — KETOROLAC TROMETHAMINE 15 MG/ML IJ SOLN
15.0000 mg | Freq: Once | INTRAMUSCULAR | Status: AC
  Administered 2021-06-13: 15 mg via INTRAVENOUS

## 2021-06-13 MED ORDER — FLUTICASONE PROPIONATE 50 MCG/ACT NA SUSP
1.0000 | Freq: Two times a day (BID) | NASAL | Status: DC | PRN
  Filled 2021-06-13: qty 16

## 2021-06-13 MED ORDER — FENTANYL CITRATE (PF) 100 MCG/2ML IJ SOLN
25.0000 ug | INTRAMUSCULAR | Status: DC | PRN
  Administered 2021-06-13 (×3): 50 ug via INTRAVENOUS

## 2021-06-13 MED ORDER — ORAL CARE MOUTH RINSE: 15.0000 mL | Freq: Once | OROMUCOSAL | Status: AC

## 2021-06-13 MED ORDER — OXYCODONE HCL 5 MG PO TABS: 5.0000 mg | ORAL_TABLET | Freq: Once | ORAL | Status: DC | PRN

## 2021-06-13 MED ORDER — PROPOFOL 10 MG/ML IV BOLUS
INTRAVENOUS | Status: DC | PRN
  Administered 2021-06-13: 40 mg via INTRAVENOUS
  Administered 2021-06-13 (×2): 30 mg via INTRAVENOUS
  Administered 2021-06-13: 200 mg via INTRAVENOUS
  Administered 2021-06-13: 30 mg via INTRAVENOUS
  Administered 2021-06-13: 50 mg via INTRAVENOUS

## 2021-06-13 MED ORDER — ALBUTEROL SULFATE (2.5 MG/3ML) 0.083% IN NEBU
2.5000 mg | INHALATION_SOLUTION | Freq: Four times a day (QID) | RESPIRATORY_TRACT | Status: DC
  Administered 2021-06-13 – 2021-06-14 (×2): 2.5 mg via RESPIRATORY_TRACT
  Filled 2021-06-13 (×2): qty 3

## 2021-06-13 MED ORDER — HYDROMORPHONE HCL 1 MG/ML IJ SOLN
INTRAMUSCULAR | Status: DC | PRN
  Administered 2021-06-13: .5 mg via INTRAVENOUS

## 2021-06-13 MED ORDER — ONDANSETRON HCL 4 MG/2ML IJ SOLN
INTRAMUSCULAR | Status: DC | PRN
  Administered 2021-06-13: 4 mg via INTRAVENOUS

## 2021-06-13 SURGICAL SUPPLY — 137 items
ADAPTER CATH SYR TO TUBING 38M (ADAPTER) ×4 IMPLANT
ADH SKN CLS APL DERMABOND .7 (GAUZE/BANDAGES/DRESSINGS) ×2
ADPR CATH LL SYR 3/32 TPR (ADAPTER) ×2
APL SWBSTK 6 STRL LF DISP (MISCELLANEOUS) ×2
APPLICATOR COTTON TIP 6 STRL (MISCELLANEOUS) ×3 IMPLANT
APPLICATOR COTTON TIP 6IN STRL (MISCELLANEOUS) ×3 IMPLANT
BAG SPEC RTRVL C1550 15 (MISCELLANEOUS) ×4
BASKET PULM ZERO TIP 12X120 (MISCELLANEOUS) ×1 IMPLANT
BLADE CLIPPER SURG (BLADE) ×4 IMPLANT
BNDG EYE OVAL (GAUZE/BANDAGES/DRESSINGS) ×6 IMPLANT
BNDG GAUZE ELAST 4 BULKY (GAUZE/BANDAGES/DRESSINGS) IMPLANT
BRUSH CYTOL CELLEBRITY 1.5X140 (MISCELLANEOUS) ×1 IMPLANT
BSKT SPEC RTRVL ZERO TP 120X12 (MISCELLANEOUS) ×2
CANISTER SUCT 3000ML PPV (MISCELLANEOUS) ×8 IMPLANT
CANNULA REDUC XI 12-8 STAPL (CANNULA) ×6
CANNULA REDUCER 12-8 DVNC XI (CANNULA) ×6 IMPLANT
CNTNR URN SCR LID CUP LEK RST (MISCELLANEOUS) ×15 IMPLANT
CONN ST 1/4X3/8  BEN (MISCELLANEOUS) ×3
CONN ST 1/4X3/8 BEN (MISCELLANEOUS) IMPLANT
CONT SPEC 4OZ STRL OR WHT (MISCELLANEOUS) ×18
COVER BACK TABLE 60X90IN (DRAPES) ×4 IMPLANT
DEFOGGER SCOPE WARMER CLEARIFY (MISCELLANEOUS) ×4 IMPLANT
DERMABOND ADVANCED (GAUZE/BANDAGES/DRESSINGS) ×1
DERMABOND ADVANCED .7 DNX12 (GAUZE/BANDAGES/DRESSINGS) ×3 IMPLANT
DEVICE TALON GRASPER (GRASPER) IMPLANT
DRAIN CHANNEL 28F RND 3/8 FF (WOUND CARE) ×1 IMPLANT
DRAIN CHANNEL 32F RND 10.7 FF (WOUND CARE) IMPLANT
DRAPE ARM DVNC X/XI (DISPOSABLE) ×12 IMPLANT
DRAPE COLUMN DVNC XI (DISPOSABLE) ×3 IMPLANT
DRAPE CV SPLIT W-CLR ANES SCRN (DRAPES) ×4 IMPLANT
DRAPE DA VINCI XI ARM (DISPOSABLE) ×12
DRAPE DA VINCI XI COLUMN (DISPOSABLE) ×3
DRAPE HALF SHEET 40X57 (DRAPES) ×4 IMPLANT
DRAPE ORTHO SPLIT 77X108 STRL (DRAPES) ×3
DRAPE SURG ORHT 6 SPLT 77X108 (DRAPES) ×3 IMPLANT
ELECT BLADE 6.5 EXT (BLADE) ×4 IMPLANT
ELECT REM PT RETURN 9FT ADLT (ELECTROSURGICAL) ×3
ELECTRODE REM PT RTRN 9FT ADLT (ELECTROSURGICAL) ×3 IMPLANT
FIBER LASER SLIMLINE GI 365 (MISCELLANEOUS) ×1 IMPLANT
FILTER STRAW FLUID ASPIR (MISCELLANEOUS) ×1 IMPLANT
FORCEPS RADIAL JAW LRG 4 PULM (INSTRUMENTS) IMPLANT
GAS CARTRIDGE  LASER (MISCELLANEOUS) ×3 IMPLANT
GAUZE 4X4 16PLY ~~LOC~~+RFID DBL (SPONGE) ×4 IMPLANT
GAUZE KITTNER 4X5 RF (MISCELLANEOUS) ×8 IMPLANT
GAUZE SPONGE 4X4 12PLY STRL (GAUZE/BANDAGES/DRESSINGS) ×1 IMPLANT
GAUZE VASELINE FOILPK 1/2 X 72 (GAUZE/BANDAGES/DRESSINGS) IMPLANT
GLOVE SURG MICRO LTX SZ7.5 (GLOVE) ×12 IMPLANT
GLOVE SURG POLYISO LF SZ8 (GLOVE) ×4 IMPLANT
GLOVE SURG SIGNA 7.5 PF LTX (GLOVE) ×4 IMPLANT
GLOVE SURG SS PI 8.0 STRL IVOR (GLOVE) ×4 IMPLANT
GOWN STRL REUS W/ TWL LRG LVL3 (GOWN DISPOSABLE) ×6 IMPLANT
GOWN STRL REUS W/ TWL XL LVL3 (GOWN DISPOSABLE) ×6 IMPLANT
GOWN STRL REUS W/TWL 2XL LVL3 (GOWN DISPOSABLE) ×4 IMPLANT
GOWN STRL REUS W/TWL LRG LVL3 (GOWN DISPOSABLE) ×6
GOWN STRL REUS W/TWL XL LVL3 (GOWN DISPOSABLE) ×6
GUARD TEETH (MISCELLANEOUS) IMPLANT
HEMOSTAT SURGICEL 2X14 (HEMOSTASIS) ×11 IMPLANT
IRRIGATION STRYKERFLOW (MISCELLANEOUS) ×3 IMPLANT
IRRIGATOR STRYKERFLOW (MISCELLANEOUS) ×3
KIT BASIN OR (CUSTOM PROCEDURE TRAY) ×4 IMPLANT
KIT CLEAN ENDO COMPLIANCE (KITS) ×4 IMPLANT
KIT SUCTION CATH 14FR (SUCTIONS) IMPLANT
KIT TURNOVER KIT B (KITS) ×4 IMPLANT
LASER FIBER FLEXIBLE (MISCELLANEOUS) ×3 IMPLANT
MARKER SKIN DUAL TIP RULER LAB (MISCELLANEOUS) ×4 IMPLANT
NDL BLUNT 16X1.5 OR ONLY (NEEDLE) ×3 IMPLANT
NDL HYPO 25GX1X1/2 BEV (NEEDLE) ×3 IMPLANT
NDL SPNL 22GX3.5 QUINCKE BK (NEEDLE) ×3 IMPLANT
NEEDLE BLUNT 16X1.5 OR ONLY (NEEDLE) ×3 IMPLANT
NEEDLE HYPO 25GX1X1/2 BEV (NEEDLE) ×3 IMPLANT
NEEDLE SPNL 22GX3.5 QUINCKE BK (NEEDLE) ×3 IMPLANT
NS IRRIG 1000ML POUR BTL (IV SOLUTION) ×8 IMPLANT
PACK CHEST (CUSTOM PROCEDURE TRAY) ×4 IMPLANT
PAD ARMBOARD 7.5X6 YLW CONV (MISCELLANEOUS) ×8 IMPLANT
PORT ACCESS TROCAR AIRSEAL 12 (TROCAR) ×3 IMPLANT
PORT ACCESS TROCAR AIRSEAL 5M (TROCAR) ×1
RADIAL JAW LRG 4 PULMONARY (INSTRUMENTS) ×1
RELOAD STAPLE 45 2.5 WHT DVNC (STAPLE) IMPLANT
RELOAD STAPLE 45 3.5 BLU DVNC (STAPLE) IMPLANT
RELOAD STAPLE 45 4.3 GRN DVNC (STAPLE) IMPLANT
RELOAD STAPLER 2.5X45 WHT DVNC (STAPLE) ×12 IMPLANT
RELOAD STAPLER 3.5X45 BLU DVNC (STAPLE) ×10 IMPLANT
RELOAD STAPLER 4.3X45 GRN DVNC (STAPLE) ×2 IMPLANT
SEAL CANN UNIV 5-8 DVNC XI (MISCELLANEOUS) ×6 IMPLANT
SEAL XI 5MM-8MM UNIVERSAL (MISCELLANEOUS) ×6
SET TRI-LUMEN FLTR TB AIRSEAL (TUBING) ×4 IMPLANT
SNARE SHORT THROW 13M SML OVAL (MISCELLANEOUS) ×1 IMPLANT
SOL ANTI FOG 6CC (MISCELLANEOUS) ×3 IMPLANT
SOLUTION ANTI FOG 6CC (MISCELLANEOUS) ×1
SOLUTION ELECTROLUBE (MISCELLANEOUS) ×4 IMPLANT
SPONGE INTESTINAL PEANUT (DISPOSABLE) ×1 IMPLANT
SPONGE T-LAP 18X18 ~~LOC~~+RFID (SPONGE) ×16 IMPLANT
SPONGE T-LAP 4X18 ~~LOC~~+RFID (SPONGE) ×4 IMPLANT
STAPLER 45 SUREFORM CVD (STAPLE) ×3
STAPLER 45 SUREFORM CVD DVNC (STAPLE) IMPLANT
STAPLER CANNULA SEAL DVNC XI (STAPLE) ×6 IMPLANT
STAPLER CANNULA SEAL XI (STAPLE) ×6
STAPLER RELOAD 2.5X45 WHITE (STAPLE) ×18
STAPLER RELOAD 2.5X45 WHT DVNC (STAPLE) ×12
STAPLER RELOAD 3.5X45 BLU DVNC (STAPLE) ×10
STAPLER RELOAD 3.5X45 BLUE (STAPLE) ×15
STAPLER RELOAD 4.3X45 GREEN (STAPLE) ×3
STAPLER RELOAD 4.3X45 GRN DVNC (STAPLE) ×2
SUT PDS AB 3-0 SH 27 (SUTURE) IMPLANT
SUT PROLENE 4 0 RB 1 (SUTURE)
SUT PROLENE 4-0 RB1 .5 CRCL 36 (SUTURE) IMPLANT
SUT SILK  1 MH (SUTURE) ×6
SUT SILK 1 MH (SUTURE) ×3 IMPLANT
SUT SILK 1 TIES 10X30 (SUTURE) ×4 IMPLANT
SUT SILK 2 0 SH (SUTURE) ×4 IMPLANT
SUT SILK 2 0SH CR/8 30 (SUTURE) IMPLANT
SUT SILK 3 0SH CR/8 30 (SUTURE) IMPLANT
SUT VIC AB 1 CTX 36 (SUTURE) ×3
SUT VIC AB 1 CTX36XBRD ANBCTR (SUTURE) ×3 IMPLANT
SUT VIC AB 2-0 CTX 36 (SUTURE) ×4 IMPLANT
SUT VIC AB 3-0 MH 27 (SUTURE) IMPLANT
SUT VIC AB 3-0 X1 27 (SUTURE) ×8 IMPLANT
SUT VICRYL 0 TIES 12 18 (SUTURE) ×4 IMPLANT
SUT VICRYL 0 UR6 27IN ABS (SUTURE) ×8 IMPLANT
SUT VICRYL 2 TP 1 (SUTURE) IMPLANT
SYR 20CC LL (SYRINGE) ×8 IMPLANT
SYR 20ML ECCENTRIC (SYRINGE) ×12 IMPLANT
SYR 5ML LL (SYRINGE) ×4 IMPLANT
SYR 5ML LUER SLIP (SYRINGE) ×4 IMPLANT
SYR BULB IRRIG 60ML STRL (SYRINGE) ×4 IMPLANT
SYR TOOMEY 50ML (SYRINGE) IMPLANT
SYSTEM RETRIEVAL ANCHOR 15 (MISCELLANEOUS) ×2 IMPLANT
SYSTEM SAHARA CHEST DRAIN ATS (WOUND CARE) ×4 IMPLANT
TAPE CLOTH 4X10 WHT NS (GAUZE/BANDAGES/DRESSINGS) ×4 IMPLANT
TOWEL GREEN STERILE (TOWEL DISPOSABLE) ×4 IMPLANT
TOWEL GREEN STERILE FF (TOWEL DISPOSABLE) ×4 IMPLANT
TRAP SPECIMEN MUCUS 40CC (MISCELLANEOUS) ×4 IMPLANT
TRAY FOLEY MTR SLVR 16FR STAT (SET/KITS/TRAYS/PACK) ×4 IMPLANT
TUBE CONNECTING 12X1/4 (SUCTIONS) ×1 IMPLANT
TUBE CONNECTING 20X1/4 (TUBING) ×4 IMPLANT
VALVE SUCTION BRONCHIO DISP (MISCELLANEOUS) ×4 IMPLANT
WATER STERILE IRR 1000ML POUR (IV SOLUTION) ×8 IMPLANT

## 2021-06-13 NOTE — Brief Op Note (Signed)
06/13/2021 ? ?1:07 PM ? ?PATIENT:  Derek Fitzgerald  41 y.o. male ? 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 ?PROCEDURE:  Procedure(s): ?XI ROBOTIC ASSISTED THORASCOPY-LEFT UPPER LOBECTOMY (Left) ?VIDEO BRONCHOSCOPY (N/A) ?LASER BRONCHOSCOPY (N/A) ?INTERCOSTAL NERVE BLOCK (Left) ?NODE DISSECTION (Left) ? ?SURGEON:  Surgeon(s) and Role: ?   * Melrose Nakayama, MD - Primary ? ?PHYSICIAN ASSISTANT: Woodrow Dulski PA-C ? ?ASSISTANTS: STAFF  ? ?ANESTHESIA:   general ? ?EBL:  105 mL  ? ?BLOOD ADMINISTERED:none ? ?DRAINS: (1 3 F) Blake drain(s) in the LEFT HEMITHORAX   ? ?LOCAL MEDICATIONS USED:  MARCAINE    and OTHER EXPAREL ? ?SPECIMEN:  Source of Specimen:  LEFT UPPER LOBE/MULTIPLE LN SAMPLES ? ?DISPOSITION OF SPECIMEN:  PATHOLOGY ? ?COUNTS:  YES ? ?TOURNIQUET:  * No tourniquets in log * ? ?DICTATION: .Other Dictation: Dictation Number PENDING ? ?PLAN OF CARE: Admit to inpatient  ? ?PATIENT DISPOSITION:  PACU - hemodynamically stable. ?  ?Delay start of Pharmacological VTE agent (>24hrs) due to surgical blood loss or risk of bleeding: yes ? ?COMPLICATIONS: NO KNOWN ? ?

## 2021-06-13 NOTE — Anesthesia Procedure Notes (Signed)
Procedure Name: Intubation ?Date/Time: 06/13/2021 9:55 AM ?Performed by: Georgia Duff, CRNA ?Pre-anesthesia Checklist: Patient identified, Emergency Drugs available, Suction available and Patient being monitored ?Patient Re-evaluated:Patient Re-evaluated prior to induction ?Oxygen Delivery Method: Circle System Utilized ?Preoxygenation: Pre-oxygenation with 100% oxygen ?Induction Type: IV induction ?Ventilation: Mask ventilation without difficulty ?Laryngoscope Size: Sabra Heck and 3 ?Grade View: Grade I ?Tube type: Oral ?Endobronchial tube: Left, Double lumen EBT and EBT position confirmed by fiberoptic bronchoscope and 37 Fr ?Number of attempts: 1 ?Airway Equipment and Method: Stylet and Oral airway ?Placement Confirmation: ETT inserted through vocal cords under direct vision, positive ETCO2 and breath sounds checked- equal and bilateral ?Tube secured with: Tape ?Dental Injury: Teeth and Oropharynx as per pre-operative assessment  ? ? ? ? ?

## 2021-06-13 NOTE — Transfer of Care (Signed)
Immediate Anesthesia Transfer of Care Note ? ?Patient: Derek Fitzgerald ? ?Procedure(s) Performed: XI ROBOTIC ASSISTED THORASCOPY-LEFT UPPER LOBECTOMY (Left: Chest) ?VIDEO BRONCHOSCOPY (Chest) ?LASER BRONCHOSCOPY (Chest) ?INTERCOSTAL NERVE BLOCK (Left: Chest) ?NODE DISSECTION (Left: Chest) ? ?Patient Location: PACU ? ?Anesthesia Type:General ? ?Level of Consciousness: drowsy and patient cooperative ? ?Airway & Oxygen Therapy: Patient Spontanous Breathing ? ?Post-op Assessment: Report given to RN and Post -op Vital signs reviewed and stable ? ?Post vital signs: Reviewed and stable ? ?Last Vitals:  ?Vitals Value Taken Time  ?BP 148/93 06/13/21 1330  ?Temp    ?Pulse 93 06/13/21 1332  ?Resp 10 06/13/21 1332  ?SpO2 95 % 06/13/21 1332  ?Vitals shown include unvalidated device data. ? ?Last Pain:  ?Vitals:  ? 06/13/21 0614  ?TempSrc:   ?PainSc: 0-No pain  ?   ? ?  ? ?Complications: No notable events documented. ?

## 2021-06-13 NOTE — Interval H&P Note (Signed)
History and Physical Interval Note: ? ?06/13/2021 ?7:14 AM ? ?Derek Fitzgerald  has presented today for surgery, with the diagnosis of CARCINOID TUMOR OF LUL BRONCHUS.  The various methods of treatment have been discussed with the patient and family. After consideration of risks, benefits and other options for treatment, the patient has consented to  Procedure(s): ?XI ROBOTIC ASSISTED THORASCOPY-LEFT UPPER LOBECTOMY (Left) ?VIDEO BRONCHOSCOPY (N/A) ?possible LASER BRONCHOSCOPY (N/A) as a surgical intervention.  The patient's history has been reviewed, patient examined, no change in status, stable for surgery.  I have reviewed the patient's chart and labs.  Questions were answered to the patient's satisfaction.   ? ? ?Melrose Nakayama ? ? ?

## 2021-06-13 NOTE — Anesthesia Postprocedure Evaluation (Signed)
Anesthesia Post Note ? ?Patient: Derek Fitzgerald ? ?Procedure(s) Performed: XI ROBOTIC ASSISTED THORASCOPY-LEFT UPPER LOBECTOMY (Left: Chest) ?VIDEO BRONCHOSCOPY (Chest) ?LASER BRONCHOSCOPY (Chest) ?INTERCOSTAL NERVE BLOCK (Left: Chest) ?NODE DISSECTION (Left: Chest) ? ?  ? ?Patient location during evaluation: PACU ?Anesthesia Type: General ?Level of consciousness: awake and alert ?Pain management: pain level controlled ?Vital Signs Assessment: post-procedure vital signs reviewed and stable ?Respiratory status: spontaneous breathing, nonlabored ventilation, respiratory function stable and patient connected to nasal cannula oxygen ?Cardiovascular status: blood pressure returned to baseline and stable ?Postop Assessment: no apparent nausea or vomiting ?Anesthetic complications: no ? ? ?No notable events documented. ? ?Last Vitals:  ?Vitals:  ? 06/13/21 1445 06/13/21 1500  ?BP: 126/90 136/83  ?Pulse: 75 75  ?Resp: 17 14  ?Temp:    ?SpO2: 94% 93%  ?  ?Last Pain:  ?Vitals:  ? 06/13/21 1500  ?TempSrc:   ?PainSc: 4   ? ? ?  ?  ?  ?  ?  ?  ? ?Audry Pili ? ? ? ? ?

## 2021-06-13 NOTE — TOC Progression Note (Signed)
Transition of Care (TOC) - Progression Note  ? ? ?Patient Details  ?Name: Derek Fitzgerald ?MRN: [id number] ?Date of Birth: [birth year] ? ?Transition of Care (TOC) CM/SW Contact  ?Angelita Ingles, RN ?Phone Number:[phone] ? ?06/13/2021, 4:07 PM ? ?Clinical Narrative:    ? ?Transition of Care (TOC) Screening Note ? ? ?Patient Details  ?Name: Derek Fitzgerald ?Date of Birth: [birth year] ? ? ?Transition of Care (TOC) CM/SW Contact:    ?Angelita Ingles, RN ?Phone Number: ?06/13/2021, 4:07 PM ? ? ? ?Transition of Care Department [facility]) has reviewed patient and no TOC needs have been identified at this time. We will continue to monitor patient advancement through interdisciplinary progression rounds.  ? ? ? ? ?  ?  ? ?Expected Discharge Plan and Services ?  ?  ?  ?  ?  ?                ?  ?  ?  ?  ?  ?  ?  ?  ?  ?  ? ? ?Social Determinants of Health (SDOH) Interventions ?  ? ?Readmission Risk Interventions ?   ? View : No data to display.  ?  ?  ?  ? ? ?

## 2021-06-13 NOTE — Anesthesia Procedure Notes (Addendum)
?  Central Venous Catheter Insertion ?Performed by: Audry Pili, MD, anesthesiologist ?Start/End4/17/2023 7:14 AM, 06/13/2021 7:22 AM ?Patient location: Pre-op. ?Preanesthetic checklist: patient identified, IV checked, risks and benefits discussed, surgical consent, monitors and equipment checked, pre-op evaluation, timeout performed and anesthesia consent ?Position: Trendelenburg ?Lidocaine 1% used for infiltration and patient sedated ?Hand hygiene performed , maximum sterile barriers used  and Seldinger technique used ?Catheter size: 8 Fr ?Central line was placed.Double lumen ?Procedure performed using ultrasound guided technique. ?Ultrasound Notes:anatomy identified, needle tip was noted to be adjacent to the nerve/plexus identified, no ultrasound evidence of intravascular and/or intraneural injection and image(s) printed for medical record ?Attempts: 1 ?Following insertion, line sutured, dressing applied and Biopatch. ?Post procedure assessment: blood return through all ports, free fluid flow and no air ? ?Patient tolerated the procedure well with no immediate complications. ? ? ? ?

## 2021-06-13 NOTE — Progress Notes (Signed)
Pt was receiving scheduled albuterol neb in which pt stated he had had before. During treatment, which was almost completed, pt started grabbing left chest and screaming saying "my lung, it hurts". Pt was holding breath and I was asking him to take deep breaths. I stopped the treatment and pt continued to have pain. When asked, pt stated it was stabbing pain. I alerted RN and she came in there to assess patient. Encouraged deep breathing, pt's o2 sats remained stable. HR increased as well as RR. Post treatment pt had better air movement in the upper left chest but crackles/coarse. Asked RN to page on call team.  ?

## 2021-06-13 NOTE — Progress Notes (Signed)
Vomited to mod amount of yellowish output. Claimed feeling better after  with no meds given. Went to sleep after. Continue to monitor. ?

## 2021-06-13 NOTE — Anesthesia Procedure Notes (Signed)
Arterial Line Insertion ?Start/End4/17/2023 6:55 AM, 06/13/2021 7:05 AM ?Performed by: Audry Pili, MD, Josephine Igo, CRNA, CRNA ? Patient location: Pre-op. ?Preanesthetic checklist: patient identified, IV checked, site marked, risks and benefits discussed, surgical consent, monitors and equipment checked, pre-op evaluation, timeout performed and anesthesia consent ?Lidocaine 1% used for infiltration ?Right, radial was placed ?Catheter size: 20 G ?Hand hygiene performed  and maximum sterile barriers used  ? ?Attempts: 1 ?Procedure performed without using ultrasound guided technique. ?Following insertion, dressing applied. ?Post procedure assessment: normal and unchanged ? ?Patient tolerated the procedure well with no immediate complications. ? ? ?

## 2021-06-13 NOTE — Anesthesia Procedure Notes (Signed)
Procedure Name: Intubation ?Date/Time: 06/13/2021 7:47 AM ?Performed by: Georgia Duff, CRNA ?Pre-anesthesia Checklist: Patient identified, Emergency Drugs available, Suction available and Patient being monitored ?Patient Re-evaluated:Patient Re-evaluated prior to induction ?Oxygen Delivery Method: Circle System Utilized ?Preoxygenation: Pre-oxygenation with 100% oxygen ?Induction Type: IV induction ?Ventilation: Mask ventilation without difficulty ?Tube type: Oral ?Tube size: 8.5 mm ?Number of attempts: 1 ?Airway Equipment and Method: Stylet and Oral airway ?Placement Confirmation: ETT inserted through vocal cords under direct vision, positive ETCO2 and breath sounds checked- equal and bilateral ?Secured at: 20 cm ?Tube secured with: Tape ?Dental Injury: Teeth and Oropharynx as per pre-operative assessment  ? ? ? ? ?

## 2021-06-14 ENCOUNTER — Inpatient Hospital Stay (HOSPITAL_COMMUNITY): Payer: BC Managed Care – PPO

## 2021-06-14 ENCOUNTER — Encounter (HOSPITAL_COMMUNITY): Payer: Self-pay | Admitting: Thoracic Surgery (Cardiothoracic Vascular Surgery)

## 2021-06-14 LAB — BASIC METABOLIC PANEL
Anion gap: 5 (ref 5–15)
BUN: 7 mg/dL (ref 6–20)
CO2: 26 mmol/L (ref 22–32)
Calcium: 8.3 mg/dL — ABNORMAL LOW (ref 8.9–10.3)
Chloride: 108 mmol/L (ref 98–111)
Creatinine, Ser: 1.16 mg/dL (ref 0.61–1.24)
GFR, Estimated: >60 mL/min (ref >60)
Glucose, Bld: 101 mg/dL — ABNORMAL HIGH (ref 70–99)
Potassium: 3.5 mmol/L (ref 3.5–5.1)
Sodium: 139 mmol/L (ref 135–145)

## 2021-06-14 LAB — CBC
HCT: 43.1 % (ref 39.0–52.0)
Hemoglobin: 14.5 g/dL (ref 13.0–17.0)
MCH: 31 pg (ref 26.0–34.0)
MCHC: 33.6 g/dL (ref 30.0–36.0)
MCV: 92.3 fL (ref 80.0–100.0)
Platelets: UNDETERMINED 10*3/uL (ref 150–400)
RBC: 4.67 MIL/uL (ref 4.22–5.81)
RDW: 12.7 % (ref 11.5–15.5)
WBC: 7.8 10*3/uL (ref 4.0–10.5)
nRBC: 0 % (ref 0.0–0.2)

## 2021-06-14 LAB — SURGICAL PATHOLOGY

## 2021-06-14 MED ORDER — CHLORHEXIDINE GLUCONATE CLOTH 2 % EX PADS
6.0000 | MEDICATED_PAD | Freq: Every day | CUTANEOUS | Status: DC
  Administered 2021-06-14 – 2021-06-15 (×2): 6 via TOPICAL

## 2021-06-14 MED ORDER — ALBUTEROL SULFATE (2.5 MG/3ML) 0.083% IN NEBU: 2.5000 mg | INHALATION_SOLUTION | RESPIRATORY_TRACT | Status: DC | PRN

## 2021-06-14 MED ORDER — KETOROLAC TROMETHAMINE 30 MG/ML IJ SOLN
30.0000 mg | Freq: Four times a day (QID) | INTRAMUSCULAR | Status: AC
  Administered 2021-06-14 – 2021-06-15 (×4): 30 mg via INTRAVENOUS
  Filled 2021-06-14 (×4): qty 1

## 2021-06-14 NOTE — Op Note (Signed)
NAME: Derek Fitzgerald, Derek A. ?MEDICAL RECORD NO: [id number] ?ACCOUNT NO: [id number] ?DATE OF BIRTH: 04/06/1980 ?FACILITY: [facility] ?LOCATION: [facility] ?PHYSICIAN: Revonda Standard. Roxan Hockey, MD ? ?Operative Report  ? ?DATE OF PROCEDURE: 06/13/2021 ? ? ?PREOPERATIVE DIAGNOSIS:  Carcinoid tumor, left upper lobe bronchus. ? 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 ? ?PROCEDURE:   ?Flexible fiberoptic bronchoscopy, laser bronchoscopy,  ?Xi robotic-assisted left upper lobectomy ?Lymph node sampling and  ?Intercostal nerve blocks levels 4 through 10. ? ?SURGEON:  Revonda Standard. Roxan Hockey, MD ? ?ASSISTANT:  Jadene Pierini, PA-C. ? ?ANESTHESIA:  General. ? ?FINDINGS:  Endobronchial tumor originating from lingular bronchus.  Multiple enlarged, but otherwise benign-appearing lymph nodes.  Bronchial margin negative for tumor. ? ?CLINICAL NOTE:  [name] is a 41 year old gentleman with a history of asthma and hyperlipidemia.  He has a history of recurrent bronchitis growing up.  He has been having issues with recurrent infections since 11/2020.  He saw Dr. Shearon Stalls.  CT showed  ?near complete left upper lobe atelectasis.  Bronchoscopy showed an obstructing mass lesion near the origin of the left upper lobe bronchus.  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  He was referred for surgical intervention.  The  ?indications, risks, benefits, and alternatives were discussed in detail with the patient.  He understood and accepted the risks and agreed to proceed. ? ?OPERATIVE NOTE:  [name] on 06/13/2021.  Anesthesia placed an arterial blood pressure monitoring line and established intravenous access.  He was taken to the operating room, anesthetized, and intubated  ?with a single lumen endotracheal tube.  A Foley catheter was placed.  Intravenous antibiotics were administered.  Sequential compression devices were placed on the calves for DVT  prophylaxis. ? ?A timeout was performed.  Flexible fiberoptic bronchoscopy was performed via the endotracheal tube.  The trachea and right bronchial tree were within normal limits.  The left mainstem was normal.  At the origin of the left upper lobe bronchus there was an endobronchial tumor.  There was some edema in the bronchus adjacent to the tumor.  The left lower lobe bronchus was normal.  The bronchoscope did pass the nodule on the superior side, so an attempt was made to resect this endobronchially.  Multiple biopsies were ? obtained.  These were all sent for permanent pathology as one specimen.  A biopsy was done of the edematous mucosa and it was sent for frozen which returned showing benign bronchial cells, no tumor was seen at that site which was near the  ?origin of the left upper lobe bronchus.  The tumor was debrided back and it was clear that it was originating in the lingular bronchus.  Attempts to resect further showed that this was not going to be resectable endobronchially. A laser fiber was advanced through the bronchoscope.  The FiO2 was decreased to 21%.  A noncontact Nd:YAG laser was used.  The surface of the tumor was lasered to achieve hemostasis.  A total of 0.6 kilojoules was used.  There was no ongoing bleeding.  The bronchoscope was removed.  The patient was reintubated with  ?a double lumen endotracheal tube.  He was placed in a right lateral decubitus position and the left chest was prepped and draped in the usual sterile fashion.  Single lung ventilation of the right lung was initiated. ? ?A second timeout was performed.  A solution containing 20 ml of liposomal bupivacaine, 30 ml of  0.5% bupivacaine and 50 ml of saline was prepared.  This solution was used for the intercostal blocks as well as local at the incisions.  An incision was made in the eighth interspace in approximately the midaxillary line, an 8 mm port was inserted.  The thoracoscope was advanced into the chest. After  confirming intrapleural placement, carbon dioxide was insufflated per protocol.  A 12 mm port was placed in the eighth interspace anterior to the camera port.  Intercostal nerve blocks then were performed from the 4th to the 10th interspace by injecting 10 mL of the bupivacaine solution into a subpleural  ?plane at each level.  An 8 mm robotic port was placed in the eighth interspace posteriorly and then posterolaterally a 12 mm port was placed.  A 12 mm AirSeal port was placed in the tenth interspace posteriorly. There was good isolation of the left lung and ? no cross ventilation, but the lung was very slow to deflate.  This made the initial dissection relatively slow.  The lung was retracted superiorly and the inferior ligament was divided with bipolar cautery.  Level 9 lymph nodes were removed.  The  ?pleural reflection was divided at the hilum posteriorly, but as the subcarinal space was approached, visualization was poor because of the over inflated lung.  Attention then was turned to the fissure.  It was nearly complete, especially anteriorly and there  ?were some nodes which were clearly visible.  The pleura was dissected off over these nodes and then a small portion of the fissure which was incomplete anteriorly   was divided with the robotic stapler.  A plane was developed along the pulmonary artery,  ?the basilar and superior segmental arteries to the lower lobe were identified and the fissure was completed posteriorly with sequential firings of the robotic stapler.  At this point, the easiest place to dissect was along the pulmonary artery in the  ?fissure and the lingular branches were isolated, encircled and divided with the robotic stapler and then the posterior PA branch was also encircled and divided with the robotic stapler.  The superior pulmonary vein was dissected out.  It was encircled  ?and divided with the robotic stapler.  There was a lymph node versus mass along the left upper lobe bronchus  between it and the anterior apical trunk, this branch arose as a common trunk.  The vessel was dissected out both from anterior and posterior  ?approaches until the stapler could comfortably be passed around it and closed and fired.  The stapler then was placed across the base of the left upper lobe bronchus and closed.  A test inflation showed aeration of the lower lobe.  Stapler was fired transecting the left ? upper lobe bronchus.  The aortopulmonary window was opened, but no lymph node was identified in that area.  Level 7 and 4L nodes were removed posteriorly.  The sponges and vessel loop were removed.  There was a single sponge still in the chest.  The  ?robotic instruments were removed.  The robot was undocked.  The anterior eighth interspace was lengthened to approximately 4 cm.  A 15 mm endoscopic retrieval bag was placed into the chest and the upper lobe was manipulated into the bag.  It then was  ?removed and sent for frozen section of the bronchial margin, which returned with no tumor seen. ? ?The remaining sponge was removed.  The chest was copiously irrigated with warm saline.  A test inflation to 30 cm water  revealed no air leakage from the bronchial stump.  There was good hemostasis at the port sites.  A 28-French Blake drain was placed  ?through the original port incision and secured with #1 silk suture.  The remaining incisions were closed in standard fashion.  The chest tube was placed to a Pleur-Evac on waterseal.  The patient was placed in a supine position.  He was extubated in the  ?operating room and taken to the postanesthetic care unit in good condition. ? ?All sponge, needle and instrument counts were correct at the end of the procedure. ? ?Experienced assistance was necessary for this case due to surgical complexity.  Jadene Pierini, [place] served as the Environmental consultant providing assistance with instrument exchange, suctioning, specimen retrieval, and wound closure.   ? ? ? ?[place] ?D: 06/13/2021 5:32:14  pm T: 06/14/2021 1:22:00 am  ?JOB: [id number]/ [id number]  ?

## 2021-06-14 NOTE — Discharge Summary (Addendum)
Physician Discharge Summary  ?Patient ID: ?Derek Fitzgerald ?MRN: [id number] ?DOB/AGE: [birth year] 40 y.o. ? ?Admit date: 06/13/2021 ?Discharge date: 06/16/2021 ? ?Admission Diagnoses: Carcinoid tumor left upper lobe- Clinical stage IA(T1,N0) vs IB (T2,N0) ? 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  S/P lobectomy of lung ?Active Problems: ?  Essential hypertension ? ?Patient Active Problem List  ? Diagnosis Date Noted  ? Essential hypertension 06/16/2021  ? 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  ? Lung nodule 05/24/2021  ? History of COVID-19 03/11/2021  ? History of asthma 03/11/2021  ? Wheezing 03/11/2021  ?  ?Discharged Condition: good ? 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 ? ?Drakkar Fitzgerald is a 41 year old man with a history of asthma and hyperlipidemia.  He had fairly regular episodes of bronchitis growing up.  Over the last 5 years he has had progressively more problems with bronchitis and wheezing.  In October 2022 he had worsening cough and shortness of breath along with wheezing.  He was diagnosed with pneumonia.  He had a chest x-ray which showed a lingular opacity.  He was treated with multiple rounds of steroids and antibiotics and given inhalers but his symptoms continued to progress. ? ?He saw Dr. Shearon Stalls.  A CT showed a nearly complete left upper lobe atelectasis with a probable endobronchial lesion.  He did bronchoscopy.  There was an obstructing mass lesion near the origin of the left upper lobe bronchus.  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  A CT of the abdomen and pelvis showed no other sites of disease. ?  ?He works for [employer].  He has noticed some shortness of breath with exertion.  He continues to have cough although the cough and wheezing in both paradoxically improved.  No significant change in appetite or weight loss.  No chest pain, pressure, or tightness. ? ?After reviewing the  patient and all of his relevant studies it was Dr. Leonarda Salon opinion that he should undergo bronchoscopy and possible robotic resection.  He was admitted this hospitalization for the procedure. ? ?Hospital course: ? ?The patient was admitted on 06/13/2021 electively and taken to the operating room.  He underwent flexible fiberoptic bronchoscopy, laser bronchoscopy, robotic left upper lobectomy with lymph node sampling and intercostal nerve blocks levels 4 through 10.  He tolerated the procedure well was taken the surgical recovery room in stable condition. ? ?Postoperative hospital course: ? ?The patient has done well.  He did have a small air leak on postoperative day #1 and chest tube was kept in place.  Additionally there was moderate drainage.  He is noted not to have any anemia.  Renal function is within normal limits and he is making good urine output.  Oxygen is being weaned and he maintains good saturations on 2 L on postop day 1.  The was weaned to room air without difficulty.  His chest tube was removed on postoperative day #2 and there is no evidence of pneumothorax.  He does have some mild left basilar atelectasis/airspace disease.  He will continue to use his incentive spirometer and increase ambulation as tolerated.  Incisions are noted to be healing well without evidence of infection.  Patient continued to progress well but it is noted that he is fairly significant hypertension with systolic blood pressures anywhere from the 140s to 190s.  He reports no previous history of this.  He was started on Cozaar 25 mg p.o. daily and will  be following up closely with primary care.  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  At the time of discharge the patient is felt to be stable. ? ? ? ?Consults: None ? ?Significant Diagnostic Studies:  ?DG Chest 2 View ? ?Result Date: 06/16/2021 ?CLINICAL DATA:  Status post lobectomy of the lung. EXAM: CHEST - 2 VIEW COMPARISON:  Radiographs dated June 15, 2021 FINDINGS: The cardiomediastinal silhouette is unchanged. Left basilar atelectasis is also unchanged. Trace left pneumothorax is unchanged. The right lung is clear. The osseous structures are unremarkable. IMPRESSION: No significant interval change or acute cardiopulmonary process. Electronically Signed   By: Keane Police D.O.   On: 06/16/2021 08:14  ? ?DG Chest 2 View ? ?Result Date: 06/15/2021 ?CLINICAL DATA:  Status post lobectomy of the lung. EXAM: CHEST - 2 VIEW COMPARISON:  June 14, 2021 FINDINGS: Left chest tube is identified unchanged. Pleural line is identified in the lateral left upper hemithorax suggesting miniscule left pneumothorax. Mild atelectasis of left lung base is noted. The right lung is clear. The mediastinal contour and cardiac silhouette are normal. Osseous structures are stable. IMPRESSION: Pleural line is identified in the lateral left upper hemithorax suggesting miniscule left pneumothorax. Mild atelectasis of left lung base. Electronically Signed   By: Abelardo Diesel M.D.   On: 06/15/2021 07:13  ? ?DG Chest 2 View ? ?Result Date: 06/10/2021 ?CLINICAL DATA:  41 year old male with a history of endobronchial carcinoid, pending surgery EXAM: CHEST - 2 VIEW COMPARISON:  04/03/2021 FINDINGS: Cardiomediastinal silhouette unchanged in size and contour. No evidence of central vascular congestion. No interlobular septal thickening. Interval resolution of left upper lobe airspace disease. No new airspace disease. No pneumothorax. No acute displaced fracture IMPRESSION: Interval resolution of airspace disease of the left upper lobe, with no acute finding on the current chest x-ray Electronically Signed   By: Corrie Mckusick D.O.   On: 06/10/2021 14:18  ? ?DG Chest 1V REPEAT Same Day ? ?Result Date: 06/15/2021 ?CLINICAL DATA:  Chest tube removal EXAM: CHEST - 1 VIEW SAME DAY COMPARISON:  06/15/2021 at 0510 hours FINDINGS: Interval removal of left chest tube. Possible trace residual left  pneumothorax, not appreciably changed from prior. Persistent left upper lobe opacity. Right lung is clear. Heart size is normal. IMPRESSION: Interval removal of left chest tube. Possible trace residual left pneumothorax, not appreciably changed from prior. Electronically Signed   By: Davina Poke D.O.   On: 06/15/2021 11:39  ? ?DG Chest Port 1 View ? ?Result Date: 06/14/2021 ?CLINICAL DATA:  Reason for exam: S/P lobectomy of lung, surgery follow-up examination Patient reports superior left chest pains with painful deep breaths. Hx of asthma, carcinoid tumor of left lung. Hx of XI ROBOTIC ASSISTED THORASCOPY-LEFT UPPER LOBECTOMY yesterday. EXAM: PORTABLE CHEST - 1 VIEW COMPARISON:  the previous day's study FINDINGS: Stable left chest tube and right IJ central line. No pneumothorax. Relatively low lung volumes. Patchy subsegmental atelectasis in the lung bases right greater than left as before. Heart size and mediastinal contours are within normal limits. No effusion. Visualized bones unremarkable. IMPRESSION: No acute change Electronically Signed   By: Lucrezia Europe M.D.   On: 06/14/2021 08:32  ? ?DG Chest Port 1 View ? ?Result Date: 06/13/2021 ?CLINICAL DATA:  Status post left lobectomy. EXAM: PORTABLE CHEST 1 VIEW COMPARISON:  Chest x-ray dated May 30, 2021 FINDINGS: Right IJ line with tip positioned near the superior cavoatrial junction. Right greater than left bibasilar opacities. Left-sided chest tube.  No large pleural effusion or evidence of pneumothorax. IMPRESSION: 1. Left-sided chest tube.  No visible pneumothorax. 2. Mild right greater than left bibasilar opacities, possibly due to atelectasis or aspiration. Electronically Signed   By: Yetta Glassman M.D.   On: 06/13/2021 14:50    ?Treatments:  ?Operative Report  ?  ?DATE OF PROCEDURE: 06/13/2021 ?  ?  ?PREOPERATIVE DIAGNOSIS:  Carcinoid tumor, left upper lobe bronchus. ?  ?POSTOPERATIVE DIAGNOSIS:  Carcinoid tumor, left upper lobe bronchus.  Clinical  stage IA (T1, N0). ?  ?PROCEDURE:  Flexible fiberoptic bronchoscopy, laser bronchoscopy, Xi robotic-assisted left upper lobectomy with lymph node sampling and intercostal nerve blocks levels 4 through 10. ?  ?SURGEON:  S

## 2021-06-14 NOTE — Progress Notes (Signed)
At the end of the breathing treatment, patient started complaining of pressure in the chest. RT made this RN aware. Upon arrival to the room, patient moaning in pain grabbing left chest. Patient described the pain as stabbing and pressure like and located around the left chest. Patient encouraged to take deep breaths. Vitals remained stable. Rapid response team called but did not respond. Charge nurse got hold of RR team and emergency chest pain protocol started. Sublingual NTG given two times with some relief in symptoms. EKG obtained. Patient on 2 L [place] down from 4 L earlier. BP taken frequently and stable. Dr. Cyndia Bent called and made aware. No new orders received. Patient verbalized some relief from the pressure and asked for frozen icy. Icy given to patient. Will continue to monitor the patient closely.  ?

## 2021-06-14 NOTE — Progress Notes (Signed)
?  Mobility Specialist Criteria Algorithm Info. ? ? 06/14/21 1030  ?Pain Assessment  ?Pain Assessment 0-10  ?Pain Score 5  ?Pain Location  ?(Surgical Site)  ?Pain Descriptors / Indicators Grimacing;Pressure  ?Pain Intervention(s) Monitored during session;Premedicated before session  ?Oxygen Therapy  ?SpO2 90 %  ?O2 Device Room Air  ?Patient Activity (if Appropriate) Ambulating  ?Pulse Oximetry Type Continuous  ?Mobility  ?Activity Ambulated with assistance in hallway; Transferred bed to chair (to chair after ambulation)  ?Range of Motion/Exercises Active;All extremities  ?Level of Assistance Independent after set-up  ?Assistive Device None;Other (Comment) ?(IV pole)  ?Distance Ambulated (ft) 400 ft  ?Activity Response Tolerated well  ? ?Patient received supine in bed eager to participate. Complained of pain with activity but subsides with rest. Is independent with bed mobility despite pressure/pain. Ambulated in hallway independently with slow gait. Tolerated ambulating without need for supplemental O2, saturating  >90% on room air. Would occasionally drop to 89% but quickly recovers. Returned to room without complaint or incident. Was left sitting chair with all needs met, call bell in reach.  ? ?06/14/2021 ?12:39 PM ? ?Derek Fitzgerald, CMS, BS EXP ?Acute [facility] Services  ?NBVAP:[phone] ?Office: [phone] ? ?

## 2021-06-14 NOTE — Hospital Course (Signed)
HPI: [name] is sent for consultation regarding endobronchial carcinoid tumor. ? ?[name] is a 41 year old man with a history of asthma and hyperlipidemia.  He had fairly regular episodes of bronchitis growing up.  Over the last 5 years he has had progressively more problems with bronchitis and wheezing.  In October 2022 he had worsening cough and shortness of breath along with wheezing.  He was diagnosed with pneumonia.  He had a chest x-ray which showed a lingular opacity.  He was treated with multiple rounds of steroids and antibiotics and given inhalers but his symptoms continued to progress. ? ?He saw Dr. Shearon Stalls.  A CT showed a nearly complete left upper lobe atelectasis with a probable endobronchial lesion.  He did bronchoscopy.  There was an obstructing mass lesion near the origin of the left upper lobe bronchus.  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  A CT of the abdomen and pelvis showed no other sites of disease. ?  ?He works for [employer].  He has noticed some shortness of breath with exertion.  He continues to have cough although the cough and wheezing in both paradoxically improved.  No significant change in appetite or weight loss.  No chest pain, pressure, or tightness. ? ?After reviewing the patient and all of his relevant studies it was Dr. Leonarda Salon opinion that he should undergo bronchoscopy and possible robotic resection.  He was admitted this hospitalization for the procedure. ? ?Hospital course: ? ?The patient was admitted on 06/13/2021 electively and taken to the operating room.  He underwent flexible fiberoptic bronchoscopy, laser bronchoscopy, robotic left upper lobectomy with lymph node sampling and intercostal nerve blocks levels 4 through 10.  He tolerated the procedure well was taken the surgical recovery room in stable condition. ? ?Postoperative hospital course: ? ?The patient has done well.  He did have a small air leak on postoperative day #1 and chest tube was  kept in place.  Additionally there was moderate drainage.  He is noted not to have any anemia.  Renal function is within normal limits and he is making good urine output.  Oxygen is being weaned and he maintains good saturations on 2 L on postop day 1.  Incisions are noted to be healing well without evidence of infection. ?

## 2021-06-14 NOTE — Progress Notes (Addendum)
? ?   ?[address] ?      [place] [address] ?            [phone]   ? ?  1 Day Post-Op Procedure(s) (LRB): ?XI ROBOTIC ASSISTED THORASCOPY-LEFT UPPER LOBECTOMY (Left) ?VIDEO BRONCHOSCOPY (N/A) ?LASER BRONCHOSCOPY (N/A) ?INTERCOSTAL NERVE BLOCK (Left) ?NODE DISSECTION (Left) ?Subjective: ?Some pain, vomited once ? ?Objective: ?Vital signs in last 24 hours: ?Temp:  [97.3 ?F (36.3 ?C)-99.4 ?F (37.4 ?C)] 99 ?F (37.2 ?C) (04/18 0413) ?Pulse Rate:  [68-97] 90 (04/17 2332) ?Cardiac Rhythm: Normal sinus rhythm (04/17 1900) ?Resp:  [11-21] 15 (04/18 0413) ?BP: (102-148)/(60-100) 123/73 (04/18 0400) ?SpO2:  [89 %-100 %] 97 % (04/18 0413) ?Arterial Line BP: (124-149)/(69-82) 131/69 (04/17 1445) ? ?Hemodynamic parameters for last 24 hours: ?  ? ?Intake/Output from previous day: ?04/17 0701 - 04/18 0700 ?In: 3703.2 [P.O.:960; I.V.:2535; IV Piggyback:108.2] ?Out: 2492 [Urine:1825; Emesis/NG output:400; Blood:105; Chest Tube:162] ?Intake/Output this shift: ?No intake/output data recorded. ? ?General appearance: alert, cooperative, and no distress ?Heart: regular rate and rhythm ?Lungs: coarse exp BS ?Abdomen: soft non-tender ?Extremities: PAS in place, no calf tenderness ?Wound: healing well ? ?Lab Results: ?No results for input(s): WBC, HGB, HCT, PLT in the last 72 hours. ?BMET:  ?Recent Labs  ?  06/14/21 ?0605  ?NA 139  ?K 3.5  ?CL 108  ?CO2 26  ?GLUCOSE 101*  ?BUN 7  ?CREATININE 1.16  ?CALCIUM 8.3*  ?  ?PT/INR: No results for input(s): LABPROT, INR in the last 72 hours. ?ABG ?   ?Component Value Date/Time  ? PHART 7.44 06/09/2021 1407  ? HCO3 23.1 06/09/2021 1407  ? ACIDBASEDEF 0.5 06/09/2021 1407  ? O2SAT 98 06/09/2021 1407  ? ?CBG (last 3)  ?No results for input(s): GLUCAP in the last 72 hours. ? ?Meds ?Scheduled Meds: ? acetaminophen  1,000 mg Oral Q6H  ? Or  ? acetaminophen (TYLENOL) oral liquid 160 mg/5 mL  1,000 mg Oral Q6H  ? albuterol  2.5 mg Nebulization QID  ? bisacodyl  10 mg Oral Daily  ?  Chlorhexidine Gluconate Cloth  6 each Topical Daily  ? enoxaparin (LOVENOX) injection  40 mg Subcutaneous Daily  ? escitalopram  10 mg Oral Daily  ? ketorolac  15 mg Intravenous Q6H  ? mometasone-formoterol  2 puff Inhalation BID  ? senna-docusate  1 tablet Oral QHS  ? ?Continuous Infusions: ? sodium chloride    ? dextrose 5 % and 0.9% NaCl 100 mL/hr at 06/14/21 0558  ? ?PRN Meds:.fentaNYL (SUBLIMAZE) injection, fluticasone, nitroGLYCERIN, ondansetron (ZOFRAN) IV, oxyCODONE, traMADol ? ?Xrays ?DG Chest Port 1 View ? ?Result Date: 06/13/2021 ?CLINICAL DATA:  Status post left lobectomy. EXAM: PORTABLE CHEST 1 VIEW COMPARISON:  Chest x-ray dated May 30, 2021 FINDINGS: Right IJ line with tip positioned near the superior cavoatrial junction. Right greater than left bibasilar opacities. Left-sided chest tube. No large pleural effusion or evidence of pneumothorax. IMPRESSION: 1. Left-sided chest tube.  No visible pneumothorax. 2. Mild right greater than left bibasilar opacities, possibly due to atelectasis or aspiration. Electronically Signed   By: Yetta Glassman M.D.   On: 06/13/2021 14:50   ? ?Assessment/Plan: ?S/P Procedure(s) (LRB): ?XI ROBOTIC ASSISTED THORASCOPY-LEFT UPPER LOBECTOMY (Left) ?VIDEO BRONCHOSCOPY (N/A) ?LASER BRONCHOSCOPY (N/A) ?INTERCOSTAL NERVE BLOCK (Left) ?NODE DISSECTION (Left) ? ?POD#1 ? ?1 afeb VSS S BP 100's-140's, mostly well controlled, sinus rhythm ?2 sats good on 2 liters ?3 CT 160 cc, serosang, no air leak- prob d/c soon ?4 good UOP,  normal renal fxn ?5 CXR no pntx, + ATX ?6 cbc pending ?7 d/c ivf, foley and central line ?8 routine pulm hygiene and rehab ? ? ? ? ? LOS: 1 day  ? ? ?John Giovanni PA-C ?Pager [phone] ?06/14/2021 ?  ?Minimal intermittent air leak, about 550 ml of serous drainage ?Keep tube in water seal today ?Overall looks great but is having significant pain- will increase Toradol to 30 mg ?Mobilize ? ?Revonda Standard Roxan Hockey, MD ?[facility] ?[phone] ? ?

## 2021-06-15 ENCOUNTER — Inpatient Hospital Stay (HOSPITAL_COMMUNITY): Payer: BC Managed Care – PPO

## 2021-06-15 LAB — COMPREHENSIVE METABOLIC PANEL
ALT: 60 U/L — ABNORMAL HIGH (ref 0–44)
AST: 35 U/L (ref 15–41)
Albumin: 3.1 g/dL — ABNORMAL LOW (ref 3.5–5.0)
Alkaline Phosphatase: 50 U/L (ref 38–126)
Anion gap: 5 (ref 5–15)
BUN: 10 mg/dL (ref 6–20)
CO2: 24 mmol/L (ref 22–32)
Calcium: 8.3 mg/dL — ABNORMAL LOW (ref 8.9–10.3)
Chloride: 109 mmol/L (ref 98–111)
Creatinine, Ser: 1.13 mg/dL (ref 0.61–1.24)
GFR, Estimated: >60 mL/min (ref >60)
Glucose, Bld: 101 mg/dL — ABNORMAL HIGH (ref 70–99)
Potassium: 3.3 mmol/L — ABNORMAL LOW (ref 3.5–5.1)
Sodium: 138 mmol/L (ref 135–145)
Total Bilirubin: 0.8 mg/dL (ref 0.3–1.2)
Total Protein: 5.6 g/dL — ABNORMAL LOW (ref 6.5–8.1)

## 2021-06-15 LAB — BASIC METABOLIC PANEL
Anion gap: 6 (ref 5–15)
BUN: 8 mg/dL (ref 6–20)
CO2: 25 mmol/L (ref 22–32)
Calcium: 8.8 mg/dL — ABNORMAL LOW (ref 8.9–10.3)
Chloride: 109 mmol/L (ref 98–111)
Creatinine, Ser: 1.14 mg/dL (ref 0.61–1.24)
GFR, Estimated: >60 mL/min (ref >60)
Glucose, Bld: 80 mg/dL (ref 70–99)
Potassium: 3.6 mmol/L (ref 3.5–5.1)
Sodium: 140 mmol/L (ref 135–145)

## 2021-06-15 LAB — CBC
HCT: 43.4 % (ref 39.0–52.0)
Hemoglobin: 14.2 g/dL (ref 13.0–17.0)
MCH: 29.9 pg (ref 26.0–34.0)
MCHC: 32.7 g/dL (ref 30.0–36.0)
MCV: 91.4 fL (ref 80.0–100.0)
Platelets: UNDETERMINED 10*3/uL (ref 150–400)
RBC: 4.75 MIL/uL (ref 4.22–5.81)
RDW: 12.3 % (ref 11.5–15.5)
WBC: 7.3 10*3/uL (ref 4.0–10.5)
nRBC: 0 % (ref 0.0–0.2)

## 2021-06-15 MED ORDER — HYDROCHLOROTHIAZIDE 12.5 MG PO TABS
25.0000 mg | ORAL_TABLET | Freq: Every day | ORAL | Status: DC
  Administered 2021-06-15 – 2021-06-16 (×2): 25 mg via ORAL
  Filled 2021-06-15 (×3): qty 2

## 2021-06-15 MED ORDER — POTASSIUM CHLORIDE CRYS ER 20 MEQ PO TBCR
40.0000 meq | EXTENDED_RELEASE_TABLET | Freq: Two times a day (BID) | ORAL | Status: AC
  Administered 2021-06-15 (×2): 40 meq via ORAL
  Filled 2021-06-15 (×2): qty 2

## 2021-06-15 MED ORDER — HYDROCHLOROTHIAZIDE 12.5 MG PO CAPS: 12.5000 mg | ORAL_CAPSULE | Freq: Every day | ORAL | Status: DC

## 2021-06-15 MED ORDER — HYDRALAZINE HCL 20 MG/ML IJ SOLN
10.0000 mg | Freq: Four times a day (QID) | INTRAMUSCULAR | Status: DC | PRN
Start: 2021-06-15 — End: 2021-06-16
  Administered 2021-06-15 – 2021-06-16 (×4): 10 mg via INTRAVENOUS
  Filled 2021-06-15 (×4): qty 1

## 2021-06-15 NOTE — Progress Notes (Signed)
?  Mobility Specialist Criteria Algorithm Info. ? ? 06/15/21 1150  ?Oxygen Therapy  ?SpO2 90 %  ?O2 Device Room Air  ?Patient Activity (if Appropriate) Ambulating  ?Mobility  ?Activity Ambulated independently in hallway  ?Range of Motion/Exercises Active;All extremities  ?Level of Assistance Independent  ?Assistive Device None  ?Distance Ambulated (ft) 400 ft  ?Activity Response Tolerated well  ? ?Patient received supine in bed eager to participate. Ambulated in hallway independently with slow gait. Was slightly SOB, oxygen saturated >90% throughout. Completed education on RPE, rest frequency, energy expenditure and pursed lip breathing. Returned to room without complaint or incident. Was left dangling EOB with all needs met, call bell in reach. ? ?06/15/2021 ?1:43 PM ? ?Derek Fitzgerald, CMS, BS EXP ?[facility]  ?DQQIW:[phone] ?Office: [phone] ? ?

## 2021-06-15 NOTE — Progress Notes (Addendum)
? ?   ?[address] ?      [place] [address] ?            [phone]   ? ?  2 Days Post-Op Procedure(s) (LRB): ?XI ROBOTIC ASSISTED THORASCOPY-LEFT UPPER LOBECTOMY (Left) ?VIDEO BRONCHOSCOPY (N/A) ?LASER BRONCHOSCOPY (N/A) ?INTERCOSTAL NERVE BLOCK (Left) ?NODE DISSECTION (Left) ?Subjective: ?Feels pretty well , much better than yesterday ? ?Objective: ?Vital signs in last 24 hours: ?Temp:  [98 ?F (36.7 ?C)-98.5 ?F (36.9 ?C)] 98.5 ?F (36.9 ?C) (04/19 0340) ?Pulse Rate:  [85] 85 (04/19 0340) ?Cardiac Rhythm: Normal sinus rhythm (04/18 1900) ?Resp:  [17-20] 20 (04/19 0340) ?BP: (139-183)/(84-104) 169/104 (04/19 0340) ?SpO2:  [90 %-96 %] 94 % (04/19 0340) ? ?Hemodynamic parameters for last 24 hours: ?  ? ?Intake/Output from previous day: ?04/18 0701 - 04/19 0700 ?In: 2988.7 [P.O.:930; I.V.:2058.7] ?Out: 1270 [Urine:900; Chest Tube:370] ?Intake/Output this shift: ?No intake/output data recorded. ? ?General appearance: alert, cooperative, and no distress ?Heart: regular rate and rhythm ?Lungs: mild coarseness left base ?Abdomen: benign ?Extremities: PAS in place no calf tenderness ?Wound: healing well ? ?Lab Results: ?Recent Labs  ?  06/14/21 ?9242 06/15/21 ?6834  ?WBC 7.8 7.3  ?HGB 14.5 14.2  ?HCT 43.1 43.4  ?PLT PLATELET CLUMPS NOTED ON SMEAR, UNABLE TO ESTIMATE PLATELET CLUMPS NOTED ON SMEAR, UNABLE TO ESTIMATE  ? ?BMET:  ?Recent Labs  ?  06/14/21 ?0605 06/15/21 ?1962  ?NA 139 138  ?K 3.5 3.3*  ?CL 108 109  ?CO2 26 24  ?GLUCOSE 101* 101*  ?BUN 7 10  ?CREATININE 1.16 1.13  ?CALCIUM 8.3* 8.3*  ?  ?PT/INR: No results for input(s): LABPROT, INR in the last 72 hours. ?ABG ?   ?Component Value Date/Time  ? PHART 7.44 06/09/2021 1407  ? HCO3 23.1 06/09/2021 1407  ? ACIDBASEDEF 0.5 06/09/2021 1407  ? O2SAT 98 06/09/2021 1407  ? ?CBG (last 3)  ?No results for input(s): GLUCAP in the last 72 hours. ? ?Meds ?Scheduled Meds: ? acetaminophen  1,000 mg Oral Q6H  ? Or  ? acetaminophen (TYLENOL) oral liquid 160  mg/5 mL  1,000 mg Oral Q6H  ? bisacodyl  10 mg Oral Daily  ? Chlorhexidine Gluconate Cloth  6 each Topical Daily  ? enoxaparin (LOVENOX) injection  40 mg Subcutaneous Daily  ? escitalopram  10 mg Oral Daily  ? mometasone-formoterol  2 puff Inhalation BID  ? senna-docusate  1 tablet Oral QHS  ? ?Continuous Infusions: ? dextrose 5 % and 0.9% NaCl 100 mL/hr at 06/15/21 0552  ? ?PRN Meds:.albuterol, fentaNYL (SUBLIMAZE) injection, fluticasone, nitroGLYCERIN, ondansetron (ZOFRAN) IV, oxyCODONE ? ?Xrays ?DG Chest 2 View ? ?Result Date: 06/15/2021 ?CLINICAL DATA:  Status post lobectomy of the lung. EXAM: CHEST - 2 VIEW COMPARISON:  June 14, 2021 FINDINGS: Left chest tube is identified unchanged. Pleural line is identified in the lateral left upper hemithorax suggesting miniscule left pneumothorax. Mild atelectasis of left lung base is noted. The right lung is clear. The mediastinal contour and cardiac silhouette are normal. Osseous structures are stable. IMPRESSION: Pleural line is identified in the lateral left upper hemithorax suggesting miniscule left pneumothorax. Mild atelectasis of left lung base. Electronically Signed   By: Abelardo Diesel M.D.   On: 06/15/2021 07:13  ? ?DG Chest Port 1 View ? ?Result Date: 06/14/2021 ?CLINICAL DATA:  Reason for exam: S/P lobectomy of lung, surgery follow-up examination Patient reports superior left chest pains with painful deep breaths. Hx of asthma, carcinoid tumor of  left lung. Hx of XI ROBOTIC ASSISTED THORASCOPY-LEFT UPPER LOBECTOMY yesterday. EXAM: PORTABLE CHEST - 1 VIEW COMPARISON:  the previous day's study FINDINGS: Stable left chest tube and right IJ central line. No pneumothorax. Relatively low lung volumes. Patchy subsegmental atelectasis in the lung bases right greater than left as before. Heart size and mediastinal contours are within normal limits. No effusion. Visualized bones unremarkable. IMPRESSION: No acute change Electronically Signed   By: Lucrezia Europe M.D.   On:  06/14/2021 08:32  ? ?DG Chest Port 1 View ? ?Result Date: 06/13/2021 ?CLINICAL DATA:  Status post left lobectomy. EXAM: PORTABLE CHEST 1 VIEW COMPARISON:  Chest x-ray dated May 30, 2021 FINDINGS: Right IJ line with tip positioned near the superior cavoatrial junction. Right greater than left bibasilar opacities. Left-sided chest tube. No large pleural effusion or evidence of pneumothorax. IMPRESSION: 1. Left-sided chest tube.  No visible pneumothorax. 2. Mild right greater than left bibasilar opacities, possibly due to atelectasis or aspiration. Electronically Signed   By: Yetta Glassman M.D.   On: 06/13/2021 14:50   ? ?Assessment/Plan: ?S/P Procedure(s) (LRB): ?XI ROBOTIC ASSISTED THORASCOPY-LEFT UPPER LOBECTOMY (Left) ?VIDEO BRONCHOSCOPY (N/A) ?LASER BRONCHOSCOPY (N/A) ?INTERCOSTAL NERVE BLOCK (Left) ?NODE DISSECTION (Left) ?POD#2 ? ?1 afebrile, hypertensive at times, S BP 139-183, no history of HTN - monitor, may need to treat, sinus rhythm ?2 sats good on RA ?3 adeq UOP, normal renal fxn ?4 hypokalemia,3.3.  replace ?5 ALT slightly elevated 60 , other LFT's normal ?6 no anemia ?7 platelet clumping ,unable to measure value ?8 CXR very small space/PNTX, atx ?9 CT 370 cc /24 h- serosang, tiny intermit air leak- , poss d/c soon- remains on H2O seal ?10 lovenox for DVT PPX ?11 routine pulm hygiene and mobilization ? ? LOS: 2 days  ? ? ?John Giovanni PA-C pager [phone] ?06/15/2021 ?  ?DC IV fluids ?No air leak. Most of drainage was yesterday morning after he first sat up- will dc chest tube ?Path pending ? ?Revonda Standard Roxan Hockey, MD ?[facility] ?[phone] ? ?

## 2021-06-16 ENCOUNTER — Inpatient Hospital Stay (HOSPITAL_COMMUNITY): Payer: BC Managed Care – PPO

## 2021-06-16 DIAGNOSIS — I10 Essential (primary) hypertension: Secondary | ICD-10-CM

## 2021-06-16 MED ORDER — LOSARTAN POTASSIUM 25 MG PO TABS: 25.0000 mg | ORAL_TABLET | Freq: Every day | ORAL | 1 refills | Status: DC

## 2021-06-16 MED ORDER — LOSARTAN POTASSIUM 50 MG PO TABS: 50.0000 mg | ORAL_TABLET | Freq: Every day | ORAL | 1 refills | Status: DC

## 2021-06-16 MED ORDER — LOSARTAN POTASSIUM 25 MG PO TABS
25.0000 mg | ORAL_TABLET | Freq: Every day | ORAL | Status: DC
  Administered 2021-06-16: 25 mg via ORAL
  Filled 2021-06-16: qty 1

## 2021-06-16 MED ORDER — LOSARTAN POTASSIUM 25 MG PO TABS
25.0000 mg | ORAL_TABLET | Freq: Once | ORAL | Status: AC
  Administered 2021-06-16: 25 mg via ORAL
  Filled 2021-06-16: qty 1

## 2021-06-16 MED ORDER — LOSARTAN POTASSIUM 50 MG PO TABS: 50.0000 mg | ORAL_TABLET | Freq: Every day | ORAL | Status: DC

## 2021-06-16 MED ORDER — OXYCODONE HCL 5 MG PO TABS: 5.0000 mg | ORAL_TABLET | Freq: Four times a day (QID) | ORAL | 0 refills | Status: AC | PRN

## 2021-06-16 NOTE — Progress Notes (Addendum)
? ?   ?[place] ?      [place] [address] ?            [phone]   ? ?  3 Days Post-Op Procedure(s) (LRB): ?XI ROBOTIC ASSISTED THORASCOPY-LEFT UPPER LOBECTOMY (Left) ?VIDEO BRONCHOSCOPY (N/A) ?LASER BRONCHOSCOPY (N/A) ?INTERCOSTAL NERVE BLOCK (Left) ?NODE DISSECTION (Left) ?Subjective: ?Feels pretty well overall ? ?Objective: ?Vital signs in last 24 hours: ?Temp:  [98.6 ?F (37 ?C)-100.1 ?F (37.8 ?C)] 98.8 ?F (37.1 ?C) (04/20 0346) ?Pulse Rate:  [86-92] 92 (04/20 0600) ?Cardiac Rhythm: Normal sinus rhythm (04/19 1900) ?Resp:  [16-25] 17 (04/20 0600) ?BP: (154-196)/(92-146) 179/112 (04/20 0600) ?SpO2:  [90 %-96 %] 95 % (04/20 0346) ? ?Hemodynamic parameters for last 24 hours: ?  ? ?Intake/Output from previous day: ?04/19 0701 - 04/20 0700 ?In: 840 [P.O.:840] ?Out: 450 [Urine:450] ?Intake/Output this shift: ?No intake/output data recorded. ? ?General appearance: alert, cooperative, and no distress ?Heart: regular rate and rhythm ?Lungs: mildly dim in bases ?Abdomen: benign ?Extremities: no edema or calf tenderness ?Wound: some serous drainage from CT site ? ?Lab Results: ?Recent Labs  ?  06/14/21 ?1497 06/15/21 ?0263  ?WBC 7.8 7.3  ?HGB 14.5 14.2  ?HCT 43.1 43.4  ?PLT PLATELET CLUMPS NOTED ON SMEAR, UNABLE TO ESTIMATE PLATELET CLUMPS NOTED ON SMEAR, UNABLE TO ESTIMATE  ? ?BMET:  ?Recent Labs  ?  06/15/21 ?7858 06/15/21 ?0830  ?NA 138 140  ?K 3.3* 3.6  ?CL 109 109  ?CO2 24 25  ?GLUCOSE 101* 80  ?BUN 10 8  ?CREATININE 1.13 1.14  ?CALCIUM 8.3* 8.8*  ?  ?PT/INR: No results for input(s): LABPROT, INR in the last 72 hours. ?ABG ?   ?Component Value Date/Time  ? PHART 7.44 06/09/2021 1407  ? HCO3 23.1 06/09/2021 1407  ? ACIDBASEDEF 0.5 06/09/2021 1407  ? O2SAT 98 06/09/2021 1407  ? ?CBG (last 3)  ?No results for input(s): GLUCAP in the last 72 hours. ? ?Meds ?Scheduled Meds: ? acetaminophen  1,000 mg Oral Q6H  ? Or  ? acetaminophen (TYLENOL) oral liquid 160 mg/5 mL  1,000 mg Oral Q6H  ? bisacodyl  10  mg Oral Daily  ? enoxaparin (LOVENOX) injection  40 mg Subcutaneous Daily  ? escitalopram  10 mg Oral Daily  ? hydrochlorothiazide  25 mg Oral Daily  ? mometasone-formoterol  2 puff Inhalation BID  ? senna-docusate  1 tablet Oral QHS  ? ?Continuous Infusions: ?PRN Meds:.albuterol, fentaNYL (SUBLIMAZE) injection, fluticasone, hydrALAZINE, ondansetron (ZOFRAN) IV, oxyCODONE ? ?Xrays ?DG Chest 2 View ? ?Result Date: 06/15/2021 ?CLINICAL DATA:  Status post lobectomy of the lung. EXAM: CHEST - 2 VIEW COMPARISON:  June 14, 2021 FINDINGS: Left chest tube is identified unchanged. Pleural line is identified in the lateral left upper hemithorax suggesting miniscule left pneumothorax. Mild atelectasis of left lung base is noted. The right lung is clear. The mediastinal contour and cardiac silhouette are normal. Osseous structures are stable. IMPRESSION: Pleural line is identified in the lateral left upper hemithorax suggesting miniscule left pneumothorax. Mild atelectasis of left lung base. Electronically Signed   By: Abelardo Diesel M.D.   On: 06/15/2021 07:13  ? ?DG Chest 1V REPEAT Same Day ? ?Result Date: 06/15/2021 ?CLINICAL DATA:  Chest tube removal EXAM: CHEST - 1 VIEW SAME DAY COMPARISON:  06/15/2021 at 0510 hours FINDINGS: Interval removal of left chest tube. Possible trace residual left pneumothorax, not appreciably changed from prior. Persistent left upper lobe opacity. Right lung is clear. Heart size is  normal. IMPRESSION: Interval removal of left chest tube. Possible trace residual left pneumothorax, not appreciably changed from prior. Electronically Signed   By: Davina Poke D.O.   On: 06/15/2021 11:39   ? ?SURGICAL PATHOLOGY  ?CASE: [id number]  ?PATIENT: Derek Fitzgerald  ?Surgical Pathology Report  ? ? ? ? 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 ? ? ? ?FINAL MICROSCOPIC DIAGNOSIS:  ? ?A. LUNG, LEFT UPPER LOBE, ENDOBRONCHIAL BIOPSY:  ?- Benign bronchial mucosa, negative for tumor   ? ?B. LUNG, LEFT UPPER LOBE, LOBECTOMY:  ?- Well-differentiated neuroendocrine tumor (typical carcinoid), 3.8 cm  ?- Resection margins are negative for tumor  ?- Tumor shows a very low mitotic activity (1/20 hpf)  ?- Eight lymph nodes, negative for tumor (0/8)  ?- See oncology table  ? ?C. LUNG, LEFT UPPER LOBE, ENDOBRONCHIAL BIOPSY:  ?- Well-differentiated neuroendocrine (typical carcinoid) tumor  ? ?D. LYMPH NODE, LEVEL 9, EXCISION:  ?- Lymph node, negative for tumor (0/1)  ? ?E. LYMPH NODE, LEVEL 9 #2, EXCISION:  ?- Lymph node, negative for tumor (0/1)  ? ?F. LYMPH NODE, 4L, EXCISION:  ?- Lymph node, negative for tumor (0/1)  ? ?G. LYMPH NODE, LEVEL 7, EXCISION:  ?- Lymph node, negative for tumor (0/1)  ? ?H. LYMPH NODE, LEVEL 12, EXCISION:  ?- Lymph node, negative for tumor (0/1)  ? ?I. LYMPH NODE, LEVEL 12 #2, EXCISION:  ?- Lymph node, negative for tumor (0/1)  ? ?J. LYMPH NODE, LEVEL 13, EXCISION:  ?- Lymph node, negative for tumor (0/1)  ? ? ? ? ? ? ?ONCOLOGY TABLE:  ? ?LUNG: Resection  ? ?Synchronous Tumors: Not applicable  ?Total Number of Primary Tumors: 1  ?Procedure: Lobectomy  ?Specimen Laterality: Left  ?Tumor Focality: Unifocal  ?Tumor Site: Upper lobe  ?Tumor Size: 3.8 cm  ?Histologic Type: Well differentiated neuroendocrine tumor (typical  ?carcinoid)  ?Visceral Pleura Invasion: Not identified  ?Direct Invasion of Adjacent Structures: No adjacent structures present  ?Lymphovascular Invasion: Not identified  ?Margins: All margins negative for invasive carcinoma  ?     Closest Margin(s) to Invasive Carcinoma: Bronchial margin  ?     Margin(s) Involved by Invasive Carcinoma: Not applicable  ? ?     Margin Status for Non-Invasive Tumor: Not applicable  ?Treatment Effect: No known presurgical therapy  ?Regional Lymph Nodes:  ?     Number of Lymph Nodes Involved: 0  ?                          Nodal Sites with Tumor: Not applicable  ?     Number of Lymph Nodes Examined: 15  ?                     Nodal  Sites Examined: Levels 4L, 7, 9, 10, 12  ?Distant Metastasis:  ?     Distant Site(s) Involved: Not applicable  ?Pathologic Stage Classification (pTNM, AJCC 8th Edition): pT2a, pN0  ?Ancillary Studies: Can be performed upon request  ?Representative Tumor Block: B4  ?Comment(s): None  ? ?(v4.2.0.1)  ? ? ? ? ? ?INTRAOPERATIVE DIAGNOSIS:  ? ?A.  Left upper lobe endobronchial mass #2: "Benign bronchial mucosa with  ?acute inflammation.  No tumor seen."  ?Intraoperative diagnosis rendered by Dr. Alric Seton at 8:58 AM on  ?06/13/2021.  ? ?B.  Left upper lobe lung-frozen on the bronchial margin: "Margin free;  ?no tumor seen"  ?Intraoperative diagnosis  rendered by Dr. Alric Seton at 12:55 PM on  ?06/13/2021.  ? ?GROSS DESCRIPTION:  ? ?A: The specimen is received fresh for rapid intraoperative consultation  ?and consists of a 0.2 cm aggregate of tan-red soft tissue.  The specimen  ?is entirely submitted for frozen section analysis, and the remnant is  ?resubmitted for permanent in 1 cassette.  ? ?B: Specimen: Received fresh for rapid intraoperative consultation  ?labeled left upper lobe lung  ?Specimen integrity: Intact, with stapled resection margins  ?Size, weight: 21.3 x 12.0 x 4.2 cm, 180 g  ?Pleura: Pink-purple, smooth, with mild anthracosis  ?Lesion: Identified within the lower mainstem bronchus, there is a 3.8 x  ?2.0 x 1.5 cm tan-red, softened lesion identified.  The lesion is loosely  ?adherent to the bronchial walls, and bulges out at the margin.  ?Margin(s): Tumor is identified underlying the staple line of the  ?bronchial margin.  The bronchial margin is submitted for frozen section  ?analysis.  The mid and upper bronchi are grossly unremarkable.  ?Hilar vessels: Grossly unremarkable  ?Nonneoplastic parenchyma: Spongy tan-red, with mild anthracosis.  The  ?tissue distal to the lesion displays tan-gray mucoid material within the  ?airways.  ?Lymph nodes: Identified at the hilum of the lung are 7 tan-gray,   ?anthracotic possible lymph nodes ranging from 0.5 to 1.3 x 0.8 x 0.5 cm.  ?Block Summary:  ?1 = frozen section remnant  ?2 = vascular margins  ?3 = cross-section of bronchus  ?4 and 5 = lesion with bronchus  ?6

## 2021-06-16 NOTE — Progress Notes (Signed)
Per dr Roxan Hockey, patient can go with current blood pressure---151/125---patient advised he will need to contact his primary care very soon to follow up for acute onset blood pressure issues--patient agreed to do so ?

## 2021-06-16 NOTE — TOC Transition Note (Signed)
Transition of Care (TOC) - CM/SW Discharge Note ? ? ?Patient Details  ?Name: Derek Fitzgerald ?MRN: [id number] ?Date of Birth: 25-Oct-1980 ? ?Transition of Care (TOC) CM/SW Contact:  ?Angelita Ingles, RN ?Phone Number:[phone] ? ?06/16/2021, 12:14 PM ? ? ?Clinical Narrative:    ?Discharge orders in. No TOC needs noted ? ? ?  ?  ? ? ?Patient Goals and CMS Choice ?  ?  ?  ? ?Discharge Placement ?  ?           ?  ?  ?  ?  ? ?Discharge Plan and Services ?  ?  ?           ?  ?  ?  ?  ?  ?  ?  ?  ?  ?  ? ?Social Determinants of Health (SDOH) Interventions ?  ? ? ?Readmission Risk Interventions ?   ? View : No data to display.  ?  ?  ?  ? ? ? ? ? ?

## 2021-06-16 NOTE — Progress Notes (Signed)
Mobility Specialist Progress Note ? ? 06/16/21 1414  ?Mobility  ?Activity Ambulated independently in hallway  ?Level of Assistance Independent  ?Assistive Device None  ?Distance Ambulated (ft) 400 ft  ?Activity Response Tolerated well  ?$Mobility charge 1 Mobility  ? ?Pre Mobility: 101 HR, 153/100 BP, 97% SpO2 ?During Mobility: 113 HR ?Post Mobility: 102 HR, 151/125 BP, 98% SpO2 ? ?Received pt sitting EOB having mild headache but agreeable. Asymptomatic throughout ambulation. Returned back to EOB awaiting d/c.  ? ?Holland Falling ?Mobility Specialist ?Phone Number [phone] ? ?

## 2021-06-17 ENCOUNTER — Telehealth: Payer: Self-pay

## 2021-06-17 NOTE — Telephone Encounter (Signed)
Transition Care Management Unsuccessful Follow-up Telephone Call ? ?Date of discharge and from where:  [facility] ? ?Attempts:  1st Attempt ? ?Reason for unsuccessful TCM follow-up call:  No answer/busy unable to leave VM. Pt has scheduled appt with PA Doyle Askew on 06/20/2021 ? ?  ?

## 2021-06-20 ENCOUNTER — Encounter: Payer: Self-pay | Admitting: Physician Assistant

## 2021-06-20 ENCOUNTER — Telehealth: Payer: Self-pay

## 2021-06-20 ENCOUNTER — Ambulatory Visit: Payer: BC Managed Care – PPO | Admitting: Physician Assistant

## 2021-06-20 VITALS — BP 137/90 | HR 82 | Temp 98.7°F | Resp 18 | Ht 69.0 in | Wt 237.0 lb

## 2021-06-20 DIAGNOSIS — E782 Mixed hyperlipidemia: Secondary | ICD-10-CM | POA: Diagnosis not present

## 2021-06-20 DIAGNOSIS — Z902 Acquired absence of lung [part of]: Secondary | ICD-10-CM

## 2021-06-20 DIAGNOSIS — I10 Essential (primary) hypertension: Secondary | ICD-10-CM

## 2021-06-20 DIAGNOSIS — F411 Generalized anxiety disorder: Secondary | ICD-10-CM

## 2021-06-20 DIAGNOSIS — Z6835 Body mass index (BMI) 35.0-35.9, adult: Secondary | ICD-10-CM

## 2021-06-20 DIAGNOSIS — R748 Abnormal levels of other serum enzymes: Secondary | ICD-10-CM

## 2021-06-20 DIAGNOSIS — E876 Hypokalemia: Secondary | ICD-10-CM

## 2021-06-20 DIAGNOSIS — K760 Fatty (change of) liver, not elsewhere classified: Secondary | ICD-10-CM

## 2021-06-20 DIAGNOSIS — R7303 Prediabetes: Secondary | ICD-10-CM

## 2021-06-20 MED ORDER — BUPROPION HCL ER (XL) 150 MG PO TB24: 150.0000 mg | ORAL_TABLET | Freq: Every day | ORAL | 1 refills | Status: DC

## 2021-06-20 MED ORDER — LOSARTAN POTASSIUM 50 MG PO TABS: 50.0000 mg | ORAL_TABLET | Freq: Every day | ORAL | 1 refills | Status: DC

## 2021-06-20 MED ORDER — ESCITALOPRAM OXALATE 10 MG PO TABS: 10.0000 mg | ORAL_TABLET | Freq: Every day | ORAL | 1 refills | Status: DC

## 2021-06-20 NOTE — Telephone Encounter (Signed)
Transition Care Management Unsuccessful Follow-up Telephone Call ? ?Date of discharge and from where:  06/16/2021, [facility] ? ?Attempts:  2nd Attempt ? ?Reason for unsuccessful TCM follow-up call:  No answer/busy # [phone], the phone rings and then goes to fast busy. ? ?Patient has appointment today  - 06/20/2021 with Carrolyn Meiers, PA @ [facility] ? ? ? ?

## 2021-06-20 NOTE — Patient Instructions (Addendum)
To help you with your side effect from the Lexapro, you will start taking Wellbutrin on a daily basis.  You will continue to take the Lexapro as well. ? ?I encourage you to check your blood pressure at home on a daily basis, keep a written log and have available for all office visits.  I encourage you to follow a low-sodium diet, get a daily walk outside and make sure that you are drinking plenty of water on a daily basis. ? ?We will call you with today's lab results, I will send cholesterol medication based on those lab results. ? ?Kennieth Rad, PA-C ?Physician Assistant ?[place] ?[email or web address] ? ? ?Low-Sodium Eating Plan ?Sodium, which is an element that makes up salt, helps you maintain a healthy balance of fluids in your body. Too much sodium can increase your blood pressure and cause fluid and waste to be held in your body. ?Your health care provider or dietitian may recommend following this plan if you have high blood pressure (hypertension), kidney disease, liver disease, or heart failure. Eating less sodium can help lower your blood pressure, reduce swelling, and protect your heart, liver, and kidneys. ?What are tips for following this plan? ?Reading food labels ?The Nutrition Facts label lists the amount of sodium in one serving of the food. If you eat more than one serving, you must multiply the listed amount of sodium by the number of servings. ?Choose foods with less than 140 mg of sodium per serving. ?Avoid foods with 300 mg of sodium or more per serving. ?Shopping ? ?Look for lower-sodium products, often labeled as "low-sodium" or "no salt added." ?Always check the sodium content, even if foods are labeled as "unsalted" or "no salt added." ?Buy fresh foods. ?Avoid canned foods and pre-made or frozen meals. ?Avoid canned, cured, or processed meats. ?Buy breads that have less than 80 mg of sodium per slice. ?Cooking ? ?Eat more  home-cooked food and less restaurant, buffet, and fast food. ?Avoid adding salt when cooking. Use salt-free seasonings or herbs instead of table salt or sea salt. Check with your health care provider or pharmacist before using salt substitutes. ?Cook with plant-based oils, such as canola, sunflower, or olive oil. ?Meal planning ?When eating at a restaurant, ask that your food be prepared with less salt or no salt, if possible. Avoid dishes labeled as brined, pickled, cured, smoked, or made with soy sauce, miso, or teriyaki sauce. ?Avoid foods that contain MSG (monosodium glutamate). MSG is sometimes added to [place] food, bouillon, and some canned foods. ?Make meals that can be grilled, baked, poached, roasted, or steamed. These are generally made with less sodium. ?General information ?Most people on this plan should limit their sodium intake to 1,500-2,000 mg (milligrams) of sodium each day. ?What foods should I eat? ?Fruits ?Fresh, frozen, or canned fruit. Fruit juice. ?Vegetables ?Fresh or frozen vegetables. "No salt added" canned vegetables. "No salt added" tomato sauce and paste. Low-sodium or reduced-sodium tomato and vegetable juice. ?Grains ?Low-sodium cereals, including oats, puffed wheat and rice, and shredded wheat. Low-sodium crackers. Unsalted rice. Unsalted pasta. Low-sodium bread. Whole-grain breads and whole-grain pasta. ?Meats and other proteins ?Fresh or frozen (no salt added) meat, poultry, seafood, and fish. Low-sodium canned tuna and salmon. Unsalted nuts. Dried peas, beans, and lentils without added salt. Unsalted canned beans. Eggs. Unsalted nut butters. ?Dairy ?Milk. Soy milk. Cheese that is naturally low in sodium, such as ricotta cheese, fresh mozzarella, or Swiss cheese. Low-sodium or reduced-sodium cheese. Cream  cheese. Yogurt. ?Seasonings and condiments ?Fresh and dried herbs and spices. Salt-free seasonings. Low-sodium mustard and ketchup. Sodium-free salad dressing. Sodium-free light  mayonnaise. Fresh or refrigerated horseradish. Lemon juice. Vinegar. ?Other foods ?Homemade, reduced-sodium, or low-sodium soups. Unsalted popcorn and pretzels. Low-salt or salt-free chips. ?The items listed above may not be a complete list of foods and beverages you can eat. Contact a dietitian for more information. ?What foods should I avoid? ?Vegetables ?Sauerkraut, pickled vegetables, and relishes. Olives. [place] fries. Onion rings. Regular canned vegetables (not low-sodium or reduced-sodium). Regular canned tomato sauce and paste (not low-sodium or reduced-sodium). Regular tomato and vegetable juice (not low-sodium or reduced-sodium). Frozen vegetables in sauces. ?Grains ?Instant hot cereals. Bread stuffing, pancake, and biscuit mixes. Croutons. Seasoned rice or pasta mixes. Noodle soup cups. Boxed or frozen macaroni and cheese. Regular salted crackers. Self-rising flour. ?Meats and other proteins ?Meat or fish that is salted, canned, smoked, spiced, or pickled. Precooked or cured meat, such as sausages or meat loaves. Berniece Salines. Ham. Pepperoni. Hot dogs. Corned beef. Chipped beef. Salt pork. Jerky. Pickled herring. Anchovies and sardines. Regular canned tuna. Salted nuts. ?Dairy ?Processed cheese and cheese spreads. Hard cheeses. Cheese curds. Blue cheese. Feta cheese. String cheese. Regular cottage cheese. Buttermilk. Canned milk. ?Fats and oils ?Salted butter. Regular margarine. Ghee. Bacon fat. ?Seasonings and condiments ?Onion salt, garlic salt, seasoned salt, table salt, and sea salt. Canned and packaged gravies. Worcestershire sauce. Tartar sauce. Barbecue sauce. Teriyaki sauce. Soy sauce, including reduced-sodium. Steak sauce. Fish sauce. Oyster sauce. Cocktail sauce. Horseradish that you find on the shelf. Regular ketchup and mustard. Meat flavorings and tenderizers. Bouillon cubes. Hot sauce. Pre-made or packaged marinades. Pre-made or packaged taco seasonings. Relishes. Regular salad dressings.  Salsa. ?Other foods ?Salted popcorn and pretzels. Corn chips and puffs. Potato and tortilla chips. Canned or dried soups. Pizza. Frozen entrees and pot pies. ?The items listed above may not be a complete list of foods and beverages you should avoid. Contact a dietitian for more information. ?Summary ?Eating less sodium can help lower your blood pressure, reduce swelling, and protect your heart, liver, and kidneys. ?Most people on this plan should limit their sodium intake to 1,500-2,000 mg (milligrams) of sodium each day. ?Canned, boxed, and frozen foods are high in sodium. Restaurant foods, fast foods, and pizza are also very high in sodium. You also get sodium by adding salt to food. ?Try to cook at home, eat more fresh fruits and vegetables, and eat less fast food and canned, processed, or prepared foods. ?This information is not intended to replace advice given to you by your health care provider. Make sure you discuss any questions you have with your health care provider. ?Document Revised: 03/21/2019 Document Reviewed: 01/15/2019 ?Elsevier Patient Education ? [place]. ? ?

## 2021-06-20 NOTE — Progress Notes (Signed)
Patient has not taken medication today ?Patient has eaten today. ?Patient denies pain at this time. ? ?

## 2021-06-20 NOTE — Progress Notes (Addendum)
? ?Established Patient Office Visit ? ?Subjective   ?Patient ID: Derek Fitzgerald, male    DOB: 05-01-1980  Age: 41 y.o. MRN: [id number] ? ?Chief Complaint  ?Patient presents with  ? Hospitalization Follow-up  ?  HTN  ? ? ?States that he was recently hospitalized.  Hospital course ? ?Admit date: 06/13/2021 ?Discharge date: 06/16/2021 ?  ?Admission Diagnoses: Right lung mass ?  ?Discharge Diagnoses:  ?Principal Problem: ?  S/P lobectomy of lung ?Active Problems: ?  Essential hypertension ?  ?Patient Active Problem List ?  Diagnosis Date Noted ?? Essential hypertension 06/16/2021 ?? 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 ?? Lung nodule 05/24/2021 ?? History of COVID-19 03/11/2021 ?? History of asthma 03/11/2021 ?? Wheezing 03/11/2021 ?  ?Discharged Condition: good ?  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 ? ?Derek Fitzgerald is a 41 year old man with a history of asthma and hyperlipidemia.  He had fairly regular episodes of bronchitis growing up.  Over the last 5 years he has had progressively more problems with bronchitis and wheezing.  In October 2022 he had worsening cough and shortness of breath along with wheezing.  He was diagnosed with pneumonia.  He had a chest x-ray which showed a lingular opacity.  He was treated with multiple rounds of steroids and antibiotics and given inhalers but his symptoms continued to progress. ? ?He saw Dr. Shearon Stalls.  A CT showed a nearly complete left upper lobe atelectasis with a probable endobronchial lesion.  He did bronchoscopy.  There was an obstructing mass lesion near the origin of the left upper lobe bronchus.  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  A CT of the abdomen and pelvis showed no other sites of disease. ?  ?He works for [employer].  He has noticed some shortness of breath with exertion.  He continues to have cough although the cough and wheezing in both paradoxically improved.  No significant change in appetite or weight loss.  No  chest pain, pressure, or tightness. ?  ?After reviewing the patient and all of his relevant studies it was Dr. Leonarda Salon opinion that he should undergo bronchoscopy and possible robotic resection.  He was admitted this hospitalization for the procedure. ? ?Hospital course: ? ?The patient was admitted on 06/13/2021 electively and taken to the operating room.  He underwent flexible fiberoptic bronchoscopy, laser bronchoscopy, robotic left upper lobectomy with lymph node sampling and intercostal nerve blocks levels 4 through 10.  He tolerated the procedure well was taken the surgical recovery room in stable condition. ? ?Postoperative hospital course: ? ?The patient has done well.  He did have a small air leak on postoperative day #1 and chest tube was kept in place.  Additionally there was moderate drainage.  He is noted not to have any anemia.  Renal function is within normal limits and he is making good urine output.  Oxygen is being weaned and he maintains good saturations on 2 L on postop day 1.  The was weaned to room air without difficulty.  His chest tube was removed on postoperative day #2 and there is no evidence of pneumothorax.  He does have some mild left basilar atelectasis/airspace disease.  He will continue to use his incentive spirometer and increase ambulation as tolerated.  Incisions are noted to be healing well without evidence of infection.  Patient continued to progress well but it is noted that he is fairly significant hypertension with systolic blood pressures anywhere from the 140s to 190s.  He reports no previous history of this.  He was started on Cozaar 25 mg p.o. daily and will be following up closely with primary care.  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  At the time of discharge the patient is felt to be stable. ? ? ?States today that he was borderline hypertension in the past, and had previously been started on cholesterol medications which he did not take.   States that he has been taking the blood pressure medication without any adverse effects.  States that he does not check his blood pressure at home, but is going to get a blood pressure monitor to be able to start doing so. ? ?States that he does have follow-ups in place. ? ?States that he has been taking Lexapro to help with anxiety, states that he does feel that it offers relief, but does endorse adverse effect of difficulty with ejaculation.  States that this has been present since he started taking the medication.  ? ?Past Medical History:  ?Diagnosis Date  ? Anxiety   ? Asthma   ? Cancer [facility])   ? Carcinoid tumor of left lung   ? 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 Depression   ? Fatty liver   ? History of kidney stones   ? Hypercholesteremia   ? ?Past Surgical History:  ?Procedure Laterality Date  ? BIOPSY  04/13/2021  ? Procedure: BIOPSY;  Surgeon: Spero Geralds, MD;  Location: [facility];  Service: Pulmonary;;  ? BRONCHIAL BRUSHINGS  04/13/2021  ? Procedure: BRONCHIAL BRUSHINGS;  Surgeon: Spero Geralds, MD;  Location: [facility];  Service: Pulmonary;;  ? BRONCHIAL WASHINGS  04/13/2021  ? Procedure: BRONCHIAL WASHINGS;  Surgeon: Spero Geralds, MD;  Location: [facility];  Service: Pulmonary;;  ? INTERCOSTAL NERVE BLOCK Left 06/13/2021  ? Procedure: INTERCOSTAL NERVE BLOCK;  Surgeon: Melrose Nakayama, MD;  Location: [place];  Service: Thoracic;  Laterality: Left;  ? LASER BRONCHOSCOPY N/A 06/13/2021  ? Procedure: LASER BRONCHOSCOPY;  Surgeon: Melrose Nakayama, MD;  Location: [place];  Service: Thoracic;  Laterality: N/A;  ? NODE DISSECTION Left 06/13/2021  ? Procedure: NODE DISSECTION;  Surgeon: Melrose Nakayama, MD;  Location: [place];  Service: Thoracic;  Laterality: Left;  ? VIDEO BRONCHOSCOPY N/A 04/13/2021  ? Procedure: VIDEO BRONCHOSCOPY WITHOUT FLUORO;  Surgeon: Spero Geralds, MD;  Location: [facility];  Service: Pulmonary;  Laterality: N/A;  ? VIDEO  BRONCHOSCOPY N/A 06/13/2021  ? Procedure: VIDEO BRONCHOSCOPY;  Surgeon: Melrose Nakayama, MD;  Location: [place];  Service: Thoracic;  Laterality: N/A;  ? WISDOM TOOTH EXTRACTION    ? ?Social History  ? ?Socioeconomic History  ? Marital status: Divorced  ?  Spouse name: Not on file  ? Number of children: Not on file  ? Years of education: Not on file  ? Highest education level: Not on file  ?Occupational History  ? Not on file  ?Tobacco Use  ? Smoking status: Never  ? Smokeless tobacco: Never  ?Vaping Use  ? Vaping Use: Never used  ?Substance and Sexual Activity  ? Alcohol use: Yes  ?  Comment: socially  ? Drug use: Never  ? Sexual activity: Not Currently  ?Other Topics Concern  ? Not on file  ?Social History Narrative  ? Not on file  ? ?Social Determinants of Health  ? ?Financial Resource Strain: Not on file  ?Food Insecurity: Not on file  ?Transportation Needs: Not on  file  ?Physical Activity: Not on file  ?Stress: Not on file  ?Social Connections: Not on file  ?Intimate Partner Violence: Not on file  ? ?Family History  ?Problem Relation Age of Onset  ? Prostate cancer Father   ? Asthma Son   ? ?Allergies  ?Allergen Reactions  ? Other Swelling and Other (See Comments)  ?  Animal dander- Eyes swell  ? ?Tree Pollen-white Wendee Copp (betula Verrucosa)- tested allergic  ? ?  ? ?Review of Systems  ?Constitutional: Negative.   ?HENT: Negative.    ?Eyes: Negative.   ?Respiratory:  Negative for shortness of breath.   ?Cardiovascular:  Negative for chest pain.  ?Gastrointestinal: Negative.   ?Genitourinary: Negative.   ?Musculoskeletal: Negative.   ?Skin: Negative.   ?Neurological: Negative.   ?Endo/Heme/Allergies: Negative.   ?Psychiatric/Behavioral: Negative.    ? ?  ?Objective:  ?  ? ?BP 137/90 (BP Location: Left Arm, Patient Position: Sitting, Cuff Size: Large)   Pulse 82   Temp 98.7 ?F (37.1 ?C) (Oral)   Resp 18   Ht 5\' 9"  (1.753 m)   Wt 237 lb (107.5 kg)   SpO2 94%   BMI 35.00 kg/m?  ?BP Readings from Last 3  Encounters:  ?06/20/21 137/90  ?06/16/21 (!) 151/125  ?06/09/21 (!) 147/97  ? ?Wt Readings from Last 3 Encounters:  ?06/20/21 237 lb (107.5 kg)  ?06/13/21 240 lb (108.9 kg)  ?06/09/21 240 lb 4.8 oz (109 kg)  ? ?  ?

## 2021-06-21 DIAGNOSIS — K760 Fatty (change of) liver, not elsewhere classified: Secondary | ICD-10-CM | POA: Insufficient documentation

## 2021-06-21 DIAGNOSIS — R748 Abnormal levels of other serum enzymes: Secondary | ICD-10-CM | POA: Insufficient documentation

## 2021-06-21 DIAGNOSIS — R7303 Prediabetes: Secondary | ICD-10-CM | POA: Insufficient documentation

## 2021-06-21 DIAGNOSIS — F411 Generalized anxiety disorder: Secondary | ICD-10-CM | POA: Insufficient documentation

## 2021-06-21 DIAGNOSIS — E782 Mixed hyperlipidemia: Secondary | ICD-10-CM | POA: Insufficient documentation

## 2021-06-21 DIAGNOSIS — E876 Hypokalemia: Secondary | ICD-10-CM | POA: Insufficient documentation

## 2021-06-21 LAB — COMP. METABOLIC PANEL (12)
AST: 73 IU/L — ABNORMAL HIGH (ref 0–40)
Albumin/Globulin Ratio: 1.5 (ref 1.2–2.2)
Albumin: 4.2 g/dL (ref 4.0–5.0)
Alkaline Phosphatase: 99 IU/L (ref 44–121)
BUN/Creatinine Ratio: 9 (ref 9–20)
BUN: 11 mg/dL (ref 6–20)
Bilirubin Total: 0.6 mg/dL (ref 0.0–1.2)
Calcium: 9.4 mg/dL (ref 8.7–10.2)
Chloride: 106 mmol/L (ref 96–106)
Creatinine, Ser: 1.21 mg/dL (ref 0.76–1.27)
Globulin, Total: 2.8 g/dL (ref 1.5–4.5)
Glucose: 82 mg/dL (ref 70–99)
Potassium: 4.6 mmol/L (ref 3.5–5.2)
Sodium: 143 mmol/L (ref 134–144)
Total Protein: 7 g/dL (ref 6.0–8.5)
eGFR: 78 mL/min/{1.73_m2} (ref >59)

## 2021-06-21 LAB — LIPID PANEL
Chol/HDL Ratio: 5.9 ratio — ABNORMAL HIGH (ref 0.0–5.0)
Cholesterol, Total: 218 mg/dL — ABNORMAL HIGH (ref 100–199)
HDL: 37 mg/dL — ABNORMAL LOW (ref >39)
LDL Chol Calc (NIH): 156 mg/dL — ABNORMAL HIGH (ref 0–99)
Triglycerides: 139 mg/dL (ref 0–149)
VLDL Cholesterol Cal: 25 mg/dL (ref 5–40)

## 2021-06-23 ENCOUNTER — Other Ambulatory Visit: Payer: Self-pay | Admitting: *Deleted

## 2021-06-23 NOTE — Progress Notes (Signed)
The proposed treatment discussed in cancer conference is for discussion purpose only and not a binding recommendation. The patient was not physcially examined nor present for their treatment options. Therefore, final treatment plans cannot be decided.  ?

## 2021-06-24 ENCOUNTER — Telehealth: Payer: Self-pay | Admitting: *Deleted

## 2021-06-24 NOTE — Telephone Encounter (Signed)
MA UTR patient due to no answer or VM only busy signal after several rings. ?

## 2021-06-24 NOTE — Telephone Encounter (Signed)
-----   Message from Kennieth Rad, PA-C sent at 06/21/2021  7:33 AM EDT ----- ?Please call patient and let him know that his cholesterol is still elevated.  He does also still have 1 liver enzyme that is elevated.  I strongly encourage him to work on a low-cholesterol diet and weight loss over the next 3 months and have his cholesterol levels rechecked prior to starting cholesterol medication at this time due to the elevated liver enzyme. ?

## 2021-06-24 NOTE — Progress Notes (Signed)
Patient has viewed results via mychart and may call the office back for any concerns. ?

## 2021-06-27 ENCOUNTER — Telehealth: Payer: Self-pay

## 2021-06-27 NOTE — Telephone Encounter (Signed)
ATC no voicemail. Dr. Shearon Stalls would like to offer pt to have My Chart visit in held spot today r/t paperwork that needs to be completed. If pt calls back please schedule in held spot on 06/27/21.  ?

## 2021-06-29 ENCOUNTER — Other Ambulatory Visit: Payer: Self-pay | Admitting: Thoracic Surgery (Cardiothoracic Vascular Surgery)

## 2021-06-29 ENCOUNTER — Telehealth: Payer: Self-pay | Admitting: Internal Medicine

## 2021-06-29 DIAGNOSIS — R911 Solitary pulmonary nodule: Secondary | ICD-10-CM

## 2021-06-29 NOTE — Telephone Encounter (Signed)
Patient's mother dropped FMLA paperwork at the office on 5/1 for Dr. Shearon Stalls.  Patient is under care of Dr. Chauncey Cruel. Roxan Hockey, [facility] and has appt. on 5/4.  I called the office and then faxed FMLA forms to fax# [phone], Attn: Manuela Schwartz.   Attempted to call patient but was unable to leave a VM.  Telephone rang and then cut-off and did not connect.  ?

## 2021-06-30 ENCOUNTER — Ambulatory Visit (INDEPENDENT_AMBULATORY_CARE_PROVIDER_SITE_OTHER): Payer: Self-pay | Admitting: Thoracic Surgery (Cardiothoracic Vascular Surgery)

## 2021-06-30 ENCOUNTER — Ambulatory Visit
Admission: RE | Admit: 2021-06-30 | Discharge: 2021-06-30 | Disposition: A | Discharge status: Home / Self Care | Payer: BC Managed Care – PPO | Source: Ambulatory Visit | Attending: Thoracic Surgery (Cardiothoracic Vascular Surgery) | Admitting: Thoracic Surgery (Cardiothoracic Vascular Surgery)

## 2021-06-30 VITALS — BP 134/82 | HR 89 | Resp 20 | Ht 69.0 in | Wt 238.0 lb

## 2021-06-30 DIAGNOSIS — R911 Solitary pulmonary nodule: Secondary | ICD-10-CM

## 2021-06-30 DIAGNOSIS — Z09 Encounter for follow-up examination after completed treatment for conditions other than malignant neoplasm: Secondary | ICD-10-CM

## 2021-06-30 NOTE — Progress Notes (Signed)
? ?   ?[place] ?      [place] [address] ?            [phone]   ? ? ?HPI: Mr. Cretella returns for follow-up after recent lung resection ? ?Hudsen Fei is a 41 year old man with a history of asthma and hyperlipidemia.  He presented with worsening cough and shortness of breath and wheezing.  He was treated for presumptive pneumonia.  Chest x-ray showed a lingular opacity.  Ultimately had a CT which showed near complete atelectasis of the left upper lobe.  Bronchoscopy showed no endobronchial mass and biopsies were positive for typical carcinoid tumor. ? ?He had a robotic assisted left upper lobectomy on 06/13/2021.  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  His postoperative course was unremarkable and he went home on day 3. ? ?Overall he is feeling well.  He has been using oxycodone occasionally but not every day.  He does complain of some numbness in the left upper quadrant region.  No respiratory issues. ? ?Past Medical History:  ?Diagnosis Date  ? Anxiety   ? Asthma   ? Cancer [facility])   ? Carcinoid tumor of left lung   ? 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 Depression   ? Fatty liver   ? History of kidney stones   ? Hypercholesteremia   ? ? ?Current Outpatient Medications  ?Medication Sig Dispense Refill  ? acetaminophen (TYLENOL) 325 MG tablet Take 650 mg by mouth every 6 (six) hours as needed for moderate pain.    ? albuterol (VENTOLIN HFA) 108 (90 Base) MCG/ACT inhaler Inhale 1-2 puffs into the lungs every 6 (six) hours as needed for wheezing or shortness of breath. 1 each 5  ? buPROPion (WELLBUTRIN XL) 150 MG 24 hr tablet Take 1 tablet (150 mg total) by mouth daily. 30 tablet 1  ? CLARITIN 10 MG tablet Take 10 mg by mouth daily as needed for allergies or rhinitis.    ? escitalopram (LEXAPRO) 10 MG tablet Take 1 tablet (10 mg total) by mouth daily. 30 tablet 1  ? FLONASE ALLERGY RELIEF 50 MCG/ACT nasal spray  Place 1-2 sprays into both nostrils 2 (two) times daily as needed for allergies or rhinitis.    ? fluticasone-salmeterol (ADVAIR HFA) 230-21 MCG/ACT inhaler Inhale 2 puffs into the lungs 2 (two) times daily. 1 each 3  ? losartan (COZAAR) 50 MG tablet Take 1 tablet (50 mg total) by mouth daily. 30 tablet 1  ? ?No current facility-administered medications for this visit.  ? ? ?Physical Exam ?BP 134/82   Pulse 89   Resp 20   Ht 5\' 9"  (1.753 m)   Wt 238 lb (108 kg)   SpO2 96% Comment: RA  BMI 35.66 kg/m?  ?41 year old man in no acute distress ?Alert and oriented x3 with no focal deficits ?Incisions healing well ?Lungs clear bilaterally ?Cardiac regular rate and rhythm ? ?Diagnostic Tests: ?I personally reviewed his chest x-ray.  Postop changes from left upper lobectomy.  No worrisome findings. ? ?Impression: ?Maximiliano Cromartie is a 41 year old non-smoker who presented with worsening cough, shortness of breath, and wheezing.  He had persistent opacity on chest x-ray which led to a CT.  He was found to have an endobronchial lesion with postobstructive atelectasis.  Bronchoscopy revealed typical carcinoid tumor. ? ?He underwent a robotic left upper lobectomy and node dissection on 06/13/2021.  His postoperative course was unremarkable and  he went home on day 3. ? ?Stage Ib (T2a, N0) typical carcinoid tumor-does not need any adjuvant therapy.  Will refer to Dr. Julien Nordmann for oncology consultation for long-term follow-up. ? ?Overall doing quite well postoperatively.  He does have some numbness and bulging in the left upper quadrant which is very common with this type of surgery.  That should improve with time.  He is not having any paresthesias currently but I did tell him to let me know if he does experience those.  He is using oxycodone occasionally.  He understands the importance of weaning off that medication. ? ?He may drive.  Appropriate precautions were discussed.  Should not do any heavy lifting or return to work for 6  weeks.  Otherwise can increase to activities as tolerated. ? ?Plan: ?Referral to Dr. Julien Nordmann ?Return to work at 6 weeks postop ?Return with PA and lateral chest x-ray in 6 weeks ? ?Melrose Nakayama, MD ?[facility] ?([phone] ? ? ? ? ?

## 2021-07-01 ENCOUNTER — Telehealth: Payer: Self-pay

## 2021-07-01 NOTE — Telephone Encounter (Signed)
FMLA form completed and faxed to the [employer] @ [phone] ?Beginning LOA 06/13/21 through 08/15/21. ?

## 2021-07-08 ENCOUNTER — Other Ambulatory Visit: Payer: Self-pay | Admitting: Physician Assistant

## 2021-07-08 DIAGNOSIS — I10 Essential (primary) hypertension: Secondary | ICD-10-CM

## 2021-07-12 ENCOUNTER — Other Ambulatory Visit: Payer: Self-pay | Admitting: Physician Assistant

## 2021-07-12 DIAGNOSIS — F411 Generalized anxiety disorder: Secondary | ICD-10-CM

## 2021-08-04 ENCOUNTER — Ambulatory Visit (INDEPENDENT_AMBULATORY_CARE_PROVIDER_SITE_OTHER): Payer: BC Managed Care – PPO | Admitting: Internal Medicine

## 2021-08-04 ENCOUNTER — Encounter: Payer: Self-pay | Admitting: Internal Medicine

## 2021-08-04 VITALS — BP 124/68 | HR 87 | Temp 98.5°F | Ht 69.0 in | Wt 244.0 lb

## 2021-08-04 DIAGNOSIS — J4540 Moderate persistent asthma, uncomplicated: Secondary | ICD-10-CM

## 2021-08-04 DIAGNOSIS — G4733 Obstructive sleep apnea (adult) (pediatric): Secondary | ICD-10-CM | POA: Diagnosis not present

## 2021-08-04 NOTE — Progress Notes (Signed)
Derek Fitzgerald    [id number]    02-Jun-1980  Primary Care Physician:Wilson, Clyde Canterbury, MD Date of Appointment: 08/04/2021 Established Patient Visit  Chief complaint:   Chief Complaint  Patient presents with   Follow-up    Pt states that his breathing is shorter now. Pt states he recently had surgery. Pt states that the Advair seems to not be helping much anymore. Pt thinks that the air quality and the weather is causing issues for his asthma.      HPI: Derek Fitzgerald is a 41 y.o. man with moderate persistent asthma and endobronchial carcinoid tumor s/p LUL lobectomy with Dr. Roxan Hockey in April 2023  Interval Updates: Here for follow up for asthma. Has been having worsening shortness of breath for the last couple of weeks.   Taking advair but misses 2-3 days out of the week.  Not taking albuterol much - keeps in his car/bag. Has used twice in the last week.   No major allergy symptoms. Still out of work. He goes back on the 19th.   Current Regimen: albuterol, advair Asthma Triggers: exertion, talking  Exacerbations in the last year: 4 rounds in the ED in the last year  History of hospitalization or intubation: none Allergy Testing: none GERD: denies Allergic Rhinitis: none ACT:  Asthma Control Test ACT Total Score  08/04/2021  3:27 PM 19  05/03/2021  3:34 PM 18   OBSTRUCTIVE SLEEP APNEA SCREENING  1.  Snoring?:  yes 2.  Tired?:  yes 3.  Observed apnea, stop breathing or choking/gasping during sleep?:  yes 4.  Pressure. HTN history?  yes 5.  BMI more than 35 kg/m2?  yes 6.  Age more than 26 yrs?  no 7.  Neck size larger than 17 in for male or 16 in for male?  yes 8.  Gender = Male?  yes  Total:  7  For general population  OSA - Low Risk : Yes to 0 - 2 questions OSA - Intermediate Risk : Yes to 3 - 4 questions OSA - High Risk : Yes to 5 - 8 questions  or Yes to 2 or more of 4 STOP questions + male gender or Yes to 2 or more of 4 STOP questions + BMI >  35kg/m2  or Yes to 2 or more of 4 STOP questions + neck circumference 17 inches / 43cm in male or 16 inches / 41cm in male  References: Rinaldo Cloud al. Anesthesiology 2008; 108: 812-821,  Gabriel Cirri et al Br Dara Hoyer 2012; 108: 720-947,  Gabriel Cirri et al J Clin Sleep Med Sept 2014.   I have reviewed the patient's family social and past medical history and updated as appropriate.   Past Medical History:  Diagnosis Date   Anxiety    Asthma    Cancer ([place])    Carcinoid tumor of left lung    Endobronchial with obstruction of the left upper lobe bronchus   Depression    Fatty liver    History of kidney stones    Hypercholesteremia     Past Surgical History:  Procedure Laterality Date   BIOPSY  04/13/2021   Procedure: BIOPSY;  Surgeon: Spero Geralds, MD;  Location: [facility];  Service: Pulmonary;;   BRONCHIAL BRUSHINGS  04/13/2021   Procedure: BRONCHIAL BRUSHINGS;  Surgeon: Spero Geralds, MD;  Location: [facility];  Service: Pulmonary;;   BRONCHIAL WASHINGS  04/13/2021   Procedure: BRONCHIAL WASHINGS;  Surgeon: Spero Geralds, MD;  Location: [facility];  Service: Pulmonary;;   INTERCOSTAL NERVE BLOCK Left 06/13/2021   Procedure: INTERCOSTAL NERVE BLOCK;  Surgeon: Melrose Nakayama, MD;  Location: [facility];  Service: Thoracic;  Laterality: Left;   LASER BRONCHOSCOPY N/A 06/13/2021   Procedure: LASER BRONCHOSCOPY;  Surgeon: Melrose Nakayama, MD;  Location: [facility];  Service: Thoracic;  Laterality: N/A;   NODE DISSECTION Left 06/13/2021   Procedure: NODE DISSECTION;  Surgeon: Melrose Nakayama, MD;  Location: [facility];  Service: Thoracic;  Laterality: Left;   VIDEO BRONCHOSCOPY N/A 04/13/2021   Procedure: VIDEO BRONCHOSCOPY WITHOUT FLUORO;  Surgeon: Spero Geralds, MD;  [facility];  Service: Pulmonary;  Laterality: N/A;   VIDEO BRONCHOSCOPY N/A 06/13/2021   Procedure: VIDEO BRONCHOSCOPY;  Surgeon: Melrose Nakayama, MD;  Location: [facility];  Service: Thoracic;   Laterality: N/A;   WISDOM TOOTH EXTRACTION      Family History  Problem Relation Age of Onset   Prostate cancer Father    Asthma Son     Social History   Occupational History   Not on file  Tobacco Use   Smoking status: Never   Smokeless tobacco: Never  Vaping Use   Vaping Use: Never used  Substance and Sexual Activity   Alcohol use: Yes    Comment: socially   Drug use: Never   Sexual activity: Not Currently     Physical Exam: Blood pressure 124/68, pulse 87, temperature 98.5 F (36.9 C), temperature source Oral, height 5\' 9"  (1.753 m), weight 244 lb (110.7 kg), SpO2 97 %.  Gen:      No acute distress ENT:  no nasal polyps, mucus membranes moist Lungs:    No increased respiratory effort, symmetric chest wall excursion, clear to auscultation bilaterally, no wheezes or crackles CV:         Regular rate and rhythm; no murmurs, rubs, or gallops.  No pedal edema   Data Reviewed: Imaging: I have personally reviewed the CT Chest which shows lingular atelectasis persistent since October 2022  PFTs:     Latest Ref Rng & Units 04/21/2021    1:06 PM  PFT Results  FVC-Pre L 3.92   FVC-Predicted Pre % 92   FVC-Post L 4.03   FVC-Predicted Post % 94   Pre FEV1/FVC % % 72   Post FEV1/FCV % % 75   FEV1-Pre L 2.83   FEV1-Predicted Pre % 81   FEV1-Post L 3.03    I have personally reviewed the patient's PFTs and normal pulmonary function.   Labs: Lab Results  Component Value Date   WBC 7.3 06/15/2021   HGB 14.2 06/15/2021   HCT 43.4 06/15/2021   MCV 91.4 06/15/2021   PLT PLATELET CLUMPS NOTED ON SMEAR, UNABLE TO ESTIMATE 06/15/2021   Lab Results  Component Value Date   NA 143 06/20/2021   K 4.6 06/20/2021   CL 106 06/20/2021   CO2 25 06/15/2021    Immunization status:  There is no immunization history on file for this patient.  External Records Personally Reviewed:   Assessment:  Moderate persistent asthma suboptimal control.  LUL endobronchial carcinoid  s/p lobectomy Possible OSA  Plan/Recommendations: resume taking Advair 1 puff twice a day. Continue albuterol as needed.  Needs home sleep study. Excessive daytime sleepiness could also be shift workers syndrome.   Return to Care: Return in about 4 months (around 12/04/2021).   Lenice Llamas, MD [facility] [place]  Office:[phone]

## 2021-08-04 NOTE — Patient Instructions (Addendum)
Please schedule follow up scheduled with myself in 4 months.  If my schedule is not open yet, we will contact you with a reminder closer to that time. Please call [phone] if you haven't heard from [place] a month before.   Start taking advair 1 puff twice a day - remember to take it twice a day.   I have ordered a home sleep apnea test.

## 2021-08-08 ENCOUNTER — Other Ambulatory Visit: Payer: Self-pay | Admitting: Thoracic Surgery (Cardiothoracic Vascular Surgery)

## 2021-08-08 DIAGNOSIS — R911 Solitary pulmonary nodule: Secondary | ICD-10-CM

## 2021-08-09 ENCOUNTER — Ambulatory Visit
Admission: RE | Admit: 2021-08-09 | Discharge: 2021-08-09 | Disposition: A | Discharge status: Home / Self Care | Payer: BC Managed Care – PPO | Source: Ambulatory Visit | Attending: Thoracic Surgery (Cardiothoracic Vascular Surgery) | Admitting: Thoracic Surgery (Cardiothoracic Vascular Surgery)

## 2021-08-09 ENCOUNTER — Encounter: Payer: Self-pay | Admitting: *Deleted

## 2021-08-09 ENCOUNTER — Ambulatory Visit (INDEPENDENT_AMBULATORY_CARE_PROVIDER_SITE_OTHER): Payer: Self-pay | Admitting: Thoracic Surgery (Cardiothoracic Vascular Surgery)

## 2021-08-09 ENCOUNTER — Telehealth: Payer: Self-pay | Admitting: Internal Medicine

## 2021-08-09 ENCOUNTER — Ambulatory Visit: Payer: BC Managed Care – PPO | Admitting: Thoracic Surgery (Cardiothoracic Vascular Surgery)

## 2021-08-09 ENCOUNTER — Other Ambulatory Visit: Payer: Self-pay | Admitting: Physician Assistant

## 2021-08-09 VITALS — BP 140/90 | HR 90 | Resp 20 | Ht 69.0 in | Wt 243.0 lb

## 2021-08-09 DIAGNOSIS — R911 Solitary pulmonary nodule: Secondary | ICD-10-CM

## 2021-08-09 DIAGNOSIS — I10 Essential (primary) hypertension: Secondary | ICD-10-CM

## 2021-08-09 DIAGNOSIS — Z902 Acquired absence of lung [part of]: Secondary | ICD-10-CM

## 2021-08-09 DIAGNOSIS — Z09 Encounter for follow-up examination after completed treatment for conditions other than malignant neoplasm: Secondary | ICD-10-CM

## 2021-08-09 NOTE — Progress Notes (Signed)
Oncology Nurse Navigator Documentation     08/09/2021    9:00 AM  Oncology Nurse Navigator Flowsheets  Navigator Follow Up Date: 08/29/2021  Navigator Follow Up Reason: New Patient Appointment  Navigator Location [facility]  Referral Date to RadOnc/MedOnc 08/09/2021  Navigator Encounter Type Other:  Barriers/Navigation Needs Coordination of Care/I received new patient referral on Derek Fitzgerald today. I notified new patient coordinator to all and schedule him to be seen on 08/29/21 with labs.   Interventions Coordination of Care  Acuity Level 2-Minimal Needs (1-2 Barriers Identified)  Time Spent with Patient 30

## 2021-08-09 NOTE — Telephone Encounter (Signed)
Scheduled appt per 6/13 referral. Pt is aware of appt date and time. Pt is aware to arrive 15 mins prior to appt time and to bring and updated insurance card. Pt is aware of appt location.

## 2021-08-09 NOTE — Telephone Encounter (Signed)
Refilled per patient request. 

## 2021-08-09 NOTE — Progress Notes (Signed)
[address]       [place] [address]             [phone]     HPI: [name] returns for a scheduled postoperative follow-up  Derek Fitzgerald is a 41 year old man with a history of asthma and hyperlipidemia.  He presented with cough, shortness of breath, and wheezing.  Chest x-ray showed a lingular opacity.  CT showed near complete atelectasis of the left upper lobe.  Bronchoscopy showed an endobronchial mass and biopsies were positive for typical carcinoid tumor.  I did a robotic assisted left upper lobectomy on 06/13/2021.  He had a stage Ib (T2, N0) low-grade neuroendocrine tumor.  His postoperative course was uncomplicated and he went home on day 3.  Saw back in the office on 06/30/2021.  He was doing well at that time.  In the interim since his last visit he continues to do well.  He is going back to work next week.  He does still have some soreness at the incisions.  He uses Tylenol for that.  A little more short of breath since the operation relative to preop.  Past Medical History:  Diagnosis Date   Anxiety    Asthma    Cancer ([place])    Carcinoid tumor of left lung    Endobronchial with obstruction of the left upper lobe bronchus   Depression    Fatty liver    History of kidney stones    Hypercholesteremia     Current Outpatient Medications  Medication Sig Dispense Refill   acetaminophen (TYLENOL) 325 MG tablet Take 650 mg by mouth every 6 (six) hours as needed for moderate pain.     albuterol (VENTOLIN HFA) 108 (90 Base) MCG/ACT inhaler Inhale 1-2 puffs into the lungs every 6 (six) hours as needed for wheezing or shortness of breath. 1 each 5   buPROPion (WELLBUTRIN XL) 150 MG 24 hr tablet TAKE 1 TABLET BY MOUTH EVERY DAY 90 tablet 0   CLARITIN 10 MG tablet Take 10 mg by mouth daily as needed for allergies or rhinitis.     escitalopram (LEXAPRO) 10 MG tablet Take 1 tablet (10 mg total) by mouth daily. 30 tablet 1   FLONASE ALLERGY RELIEF 50 MCG/ACT  nasal spray Place 1-2 sprays into both nostrils 2 (two) times daily as needed for allergies or rhinitis.     fluticasone-salmeterol (ADVAIR HFA) 230-21 MCG/ACT inhaler Inhale 2 puffs into the lungs 2 (two) times daily. 1 each 3   losartan (COZAAR) 50 MG tablet Take 1 tablet (50 mg total) by mouth daily. 30 tablet 1   No current facility-administered medications for this visit.    Physical Exam BP 140/90   Pulse 90   Resp 20   Ht 5\' 9"  (1.753 m)   Wt 243 lb (110.2 kg)   SpO2 95%   BMI 35.60 kg/m  41 year old man in no acute distress Alert to x3 with no focal deficits Incisions well-healed Lungs clear bilaterally  Diagnostic Tests: I personally reviewed the chest x-ray.  Postoperative changes from left upper lobectomy.  Otherwise no active disease.  Impression: Derek Fitzgerald is a 41 year old gentleman with a history of asthma and hyperlipidemia who was found to have an endobronchial mass.  This turned out to be a stage Ib (T2a, N0) carcinoid tumor.  Stage Ib carcinoid tumor-typical carcinoid.  Nodes negative.  No indication for any adjuvant therapy.  I do want to get him into see  Dr. Julien Nordmann with oncology for his long-term follow-up.  We will arrange an appointment with him in the near future.  Asthma-managed by Dr. Shearon Stalls.  Symptoms well controlled at present.  Status post left upper lobectomy-doing well.  No restrictions on his activities.  Does still have some soreness.  Plan: Referral to Dr. Julien Nordmann Return in 6 months to check on progress He knows to call me if he has any issues in the meantime.  Melrose Nakayama, MD [facility] [phone]

## 2021-08-22 ENCOUNTER — Encounter: Payer: Self-pay | Admitting: Family Medicine

## 2021-08-22 ENCOUNTER — Ambulatory Visit (INDEPENDENT_AMBULATORY_CARE_PROVIDER_SITE_OTHER): Payer: BC Managed Care – PPO | Admitting: Family Medicine

## 2021-08-22 VITALS — BP 137/94 | HR 73 | Temp 98.1°F | Resp 16

## 2021-08-22 DIAGNOSIS — F411 Generalized anxiety disorder: Secondary | ICD-10-CM

## 2021-08-22 DIAGNOSIS — E782 Mixed hyperlipidemia: Secondary | ICD-10-CM | POA: Diagnosis not present

## 2021-08-22 DIAGNOSIS — I10 Essential (primary) hypertension: Secondary | ICD-10-CM | POA: Diagnosis not present

## 2021-08-22 MED ORDER — LOSARTAN POTASSIUM 100 MG PO TABS: 100.0000 mg | ORAL_TABLET | Freq: Every day | ORAL | 0 refills | Status: DC

## 2021-08-22 MED ORDER — BUPROPION HCL ER (XL) 150 MG PO TB24: 150.0000 mg | ORAL_TABLET | Freq: Every day | ORAL | 0 refills | Status: DC

## 2021-08-22 MED ORDER — ATORVASTATIN CALCIUM 20 MG PO TABS: 20.0000 mg | ORAL_TABLET | Freq: Every day | ORAL | 0 refills | Status: DC

## 2021-08-29 ENCOUNTER — Inpatient Hospital Stay: Payer: BC Managed Care – PPO | Attending: Internal Medicine | Admitting: Internal Medicine

## 2021-08-29 ENCOUNTER — Other Ambulatory Visit: Payer: Self-pay

## 2021-08-29 ENCOUNTER — Inpatient Hospital Stay: Payer: BC Managed Care – PPO

## 2021-08-29 VITALS — BP 140/95 | HR 82 | Temp 98.6°F | Resp 18 | Ht 71.0 in | Wt 238.0 lb

## 2021-08-29 DIAGNOSIS — C7A090 Malignant carcinoid tumor of the bronchus and lung: Secondary | ICD-10-CM | POA: Diagnosis not present

## 2021-08-29 DIAGNOSIS — Z902 Acquired absence of lung [part of]: Secondary | ICD-10-CM | POA: Insufficient documentation

## 2021-08-29 DIAGNOSIS — K760 Fatty (change of) liver, not elsewhere classified: Secondary | ICD-10-CM | POA: Diagnosis not present

## 2021-08-29 DIAGNOSIS — Z8042 Family history of malignant neoplasm of prostate: Secondary | ICD-10-CM | POA: Diagnosis not present

## 2021-08-29 LAB — CMP (CANCER CENTER ONLY)
ALT: 106 U/L — ABNORMAL HIGH (ref 0–44)
AST: 36 U/L (ref 15–41)
Albumin: 4.4 g/dL (ref 3.5–5.0)
Alkaline Phosphatase: 89 U/L (ref 38–126)
Anion gap: 7 (ref 5–15)
BUN: 14 mg/dL (ref 6–20)
CO2: 26 mmol/L (ref 22–32)
Calcium: 9.3 mg/dL (ref 8.9–10.3)
Chloride: 108 mmol/L (ref 98–111)
Creatinine: 1.23 mg/dL (ref 0.61–1.24)
GFR, Estimated: >60 mL/min (ref >60)
Glucose, Bld: 82 mg/dL (ref 70–99)
Potassium: 4 mmol/L (ref 3.5–5.1)
Sodium: 141 mmol/L (ref 135–145)
Total Bilirubin: 0.7 mg/dL (ref 0.3–1.2)
Total Protein: 7.3 g/dL (ref 6.5–8.1)

## 2021-08-29 LAB — CBC WITH DIFFERENTIAL (CANCER CENTER ONLY)
Abs Immature Granulocytes: 0.01 10*3/uL (ref 0.00–0.07)
Basophils Absolute: 0 10*3/uL (ref 0.0–0.1)
Basophils Relative: 1 %
Eosinophils Absolute: 0.2 10*3/uL (ref 0.0–0.5)
Eosinophils Relative: 3 %
HCT: 43.7 % (ref 39.0–52.0)
Hemoglobin: 15.4 g/dL (ref 13.0–17.0)
Immature Granulocytes: 0 %
Lymphocytes Relative: 45 %
Lymphs Abs: 3 10*3/uL (ref 0.7–4.0)
MCH: 31.2 pg (ref 26.0–34.0)
MCHC: 35.2 g/dL (ref 30.0–36.0)
MCV: 88.5 fL (ref 80.0–100.0)
Monocytes Absolute: 0.7 10*3/uL (ref 0.1–1.0)
Monocytes Relative: 11 %
Neutro Abs: 2.6 10*3/uL (ref 1.7–7.7)
Neutrophils Relative %: 40 %
Platelet Count: 180 10*3/uL (ref 150–400)
RBC: 4.94 MIL/uL (ref 4.22–5.81)
RDW: 12.7 % (ref 11.5–15.5)
WBC Count: 6.5 10*3/uL (ref 4.0–10.5)
nRBC: 0 % (ref 0.0–0.2)

## 2021-08-29 NOTE — Addendum Note (Signed)
Addended by: Ardeen Garland on: 08/29/2021 04:14 PM   Modules accepted: Orders

## 2021-08-29 NOTE — Progress Notes (Signed)
[facility] [phone]   [phone]  CONSULT NOTE  REFERRING PHYSICIAN: Dr. Modesto Charon  REASON FOR CONSULTATION:  41 years old white male diagnosed with lung cancer.  HPI Derek Fitzgerald is a 41 y.o. male never smoker with past medical history significant for asthma, anxiety, depression, dyslipidemia.  The patient was seen at the emergency department on April 03, 2021 complaining of chest pain and during his evaluation chest x-ray was performed and it showed increasing left perihilar opacities suspicious for increasing airspace disease/pneumonia or atelectasis.  This was followed by CT scan of the chest with contrast on the same day and that showed occlusion of the left upper lobe bronchus with left upper lobe collapse.  The possibility of underlying pneumonia or neoplasm cannot be excluded and bronchoscopy was recommended.  He was treated for pneumonia but no improvement in his condition.  On April 13, 2021 the patient underwent bronchoscopy with biopsy of the left upper lobe endobronchial mass by Dr. Shearon Stalls.  The final pathology ([id number]) was consistent with neuroendocrine tumor, with biopsy findings predictive for typical carcinoid/neuroendocrine tumor-grade 1. The tumor is positive for the neuroendocrine markers (CD56, chromogranin, and synaptophysin) and negative for TTF-1 (typically positive in small cell carcinoma).  Histologically, no mitoses or necrosis are seen.  The Ki-67 mitotic index is estimated at 2%. The patient also had CT scan of the abdomen and pelvis on April 28, 2021 and that showed no acute findings in the abdomen or pelvis or evidence of metastatic disease.  The patient had fatty liver.  The patient was referred to Dr. Roxan Hockey and on 06/13/2021 he underwent fiberoptic bronchoscopy with laser bronchoscopy and robotic assisted left upper lobectomy with lymph node sampling. The final pathology ([id number]) showed poorly  differentiated neuroendocrine tumor (typical carcinoid) measuring 3.8 cm with negative resection margin.  The resection margins were negative for malignancy and there was no evidence for visceral pleural or lymphovascular invasion and the dissected lymph nodes were negative for malignancy. Dr. Roxan Hockey kindly referred the patient to me today for evaluation and recommendation regarding his condition. When seen today the patient is feeling fine except for the soreness on the left side of the chest from the surgical scar.  He also has shortness of breath with exertion with mild cough but no hemoptysis.  He denied having any nausea, vomiting, diarrhea or constipation.  He has no headache or visual changes.  He lost few pounds recently. Family history significant for father with prostate cancer and son had asthma. The patient is divorced and has 3 children 2 sons and 1 daughter.  He works for [employer].  He has no history for smoking but drinks regularly and no history of drug abuse. HPI  Past Medical History:  Diagnosis Date   Anxiety    Asthma    Cancer ([place])    Carcinoid tumor of left lung    Endobronchial with obstruction of the left upper lobe bronchus   Depression    Fatty liver    History of kidney stones    Hypercholesteremia     Past Surgical History:  Procedure Laterality Date   BIOPSY  04/13/2021   Procedure: BIOPSY;  Surgeon: Spero Geralds, MD;  [facility]: [facility];  Service: Pulmonary;;   BRONCHIAL BRUSHINGS  04/13/2021   Procedure: BRONCHIAL BRUSHINGS;  Surgeon: Spero Geralds, MD;  [facility]: [facility];  Service: Pulmonary;;   BRONCHIAL WASHINGS  04/13/2021   Procedure: BRONCHIAL WASHINGS;  Surgeon: Lenice Llamas  S, MD;  Location: [facility];  Service: Pulmonary;;   INTERCOSTAL NERVE BLOCK Left 06/13/2021   Procedure: INTERCOSTAL NERVE BLOCK;  Surgeon: Melrose Nakayama, MD;  Location: [facility];  Service: Thoracic;  Laterality: Left;   LASER BRONCHOSCOPY N/A 06/13/2021    Procedure: LASER BRONCHOSCOPY;  Surgeon: Melrose Nakayama, MD;  Location: [facility];  Service: Thoracic;  Laterality: N/A;   NODE DISSECTION Left 06/13/2021   Procedure: NODE DISSECTION;  Surgeon: Melrose Nakayama, MD;  Location: [facility];  Service: Thoracic;  Laterality: Left;   VIDEO BRONCHOSCOPY N/A 04/13/2021   Procedure: VIDEO BRONCHOSCOPY WITHOUT FLUORO;  Surgeon: Spero Geralds, MD;  [facility];  Service: Pulmonary;  Laterality: N/A;   VIDEO BRONCHOSCOPY N/A 06/13/2021   Procedure: VIDEO BRONCHOSCOPY;  Surgeon: Melrose Nakayama, MD;  Location: [facility];  Service: Thoracic;  Laterality: N/A;   WISDOM TOOTH EXTRACTION      Family History  Problem Relation Age of Onset   Prostate cancer Father    Asthma Son     Social History Social History   Tobacco Use   Smoking status: Never   Smokeless tobacco: Never  Vaping Use   Vaping Use: Never used  Substance Use Topics   Alcohol use: Yes    Comment: socially   Drug use: Never    Allergies  Allergen Reactions   Other Swelling and Other (See Comments)    Animal dander- Eyes swell   Tree Pollen-white Birch (betula [place])- tested allergic    Current Outpatient Medications  Medication Sig Dispense Refill   acetaminophen (TYLENOL) 325 MG tablet Take 650 mg by mouth every 6 (six) hours as needed for moderate pain.     albuterol (VENTOLIN HFA) 108 (90 Base) MCG/ACT inhaler Inhale 1-2 puffs into the lungs every 6 (six) hours as needed for wheezing or shortness of breath. 1 each 5   atorvastatin (LIPITOR) 20 MG tablet Take 1 tablet (20 mg total) by mouth daily. 90 tablet 0   buPROPion (WELLBUTRIN XL) 150 MG 24 hr tablet Take 1 tablet (150 mg total) by mouth daily. 90 tablet 0   CLARITIN 10 MG tablet Take 10 mg by mouth daily as needed for allergies or rhinitis.     escitalopram (LEXAPRO) 10 MG tablet Take 1 tablet (10 mg total) by mouth daily. 30 tablet 1   FLONASE ALLERGY RELIEF 50 MCG/ACT nasal spray Place  1-2 sprays into both nostrils 2 (two) times daily as needed for allergies or rhinitis.     fluticasone-salmeterol (ADVAIR HFA) 230-21 MCG/ACT inhaler Inhale 2 puffs into the lungs 2 (two) times daily. 1 each 3   losartan (COZAAR) 100 MG tablet Take 1 tablet (100 mg total) by mouth daily. 90 tablet 0   losartan (COZAAR) 50 MG tablet TAKE 1 TABLET BY MOUTH EVERY DAY 30 tablet 0   No current facility-administered medications for this visit.    Review of Systems  Constitutional: positive for fatigue and weight loss Eyes: negative Ears, nose, mouth, throat, and face: negative Respiratory: positive for pleurisy/chest pain Cardiovascular: negative Gastrointestinal: negative Genitourinary:negative Integument/breast: negative Hematologic/lymphatic: negative Musculoskeletal:negative Neurological: negative Behavioral/Psych: negative Endocrine: negative Allergic/Immunologic: negative  Physical Exam  JQZ:ESPQZ, healthy, no distress, well nourished, and well developed SKIN: skin color, texture, turgor are normal, no rashes or significant lesions HEAD: Normocephalic, No masses, lesions, tenderness or abnormalities EYES: normal, PERRLA, Conjunctiva are pink and non-injected EARS: External ears normal, Canals clear OROPHARYNX:no exudate, no erythema, and lips, buccal mucosa, and tongue  normal  NECK: supple, no adenopathy, no JVD LYMPH:  no palpable lymphadenopathy, no hepatosplenomegaly LUNGS: clear to auscultation , and palpation HEART: regular rate & rhythm, no murmurs, and no gallops ABDOMEN:abdomen soft, non-tender, obese, normal bowel sounds, and no masses or organomegaly BACK: Back symmetric, no curvature., No CVA tenderness EXTREMITIES:no joint deformities, effusion, or inflammation, no edema  NEURO: alert & oriented x 3 with fluent speech, no focal motor/sensory deficits  PERFORMANCE STATUS: ECOG 1  LABORATORY DATA: Lab Results  Component Value Date   WBC 6.5 08/29/2021   HGB  15.4 08/29/2021   HCT 43.7 08/29/2021   MCV 88.5 08/29/2021   PLT 180 08/29/2021      Chemistry      Component Value Date/Time   NA 143 06/20/2021 1552   K 4.6 06/20/2021 1552   CL 106 06/20/2021 1552   CO2 25 06/15/2021 0830   BUN 11 06/20/2021 1552   CREATININE 1.21 06/20/2021 1552      Component Value Date/Time   CALCIUM 9.4 06/20/2021 1552   ALKPHOS 99 06/20/2021 1552   AST 73 (H) 06/20/2021 1552   ALT 60 (H) 06/15/2021 0049   BILITOT 0.6 06/20/2021 1552       RADIOGRAPHIC STUDIES: DG Chest 2 View  Result Date: 08/09/2021 CLINICAL DATA:  Lung nodule EXAM: CHEST - 2 VIEW COMPARISON:  Chest x-ray dated Jun 30, 2021 FINDINGS: The heart size and mediastinal contours are within normal limits. Postsurgical changes of the left lung. Both lungs are clear. The visualized skeletal structures are unremarkable. IMPRESSION: No active cardiopulmonary disease. Electronically Signed   By: Yetta Glassman M.D.   On: 08/09/2021 09:13    ASSESSMENT: This is a very pleasant 41 years old African-American male recently diagnosed with stage Ib (T2 a, N0, M0) well-differentiated neuroendocrine carcinoma, carcinoid tumor measuring 3.8 cm status post left upper lobectomy with lymph node sampling on June 13, 2021 under the care of Dr. Roxan Hockey.  PLAN: I had a lengthy discussion with the patient today about his current disease stage, prognosis and treatment options. I explained to the patient that he had a curable option for his disease with the surgical resection and there is no survival benefit for additional systemic chemotherapy or radiation. I also explained to the patient that the current standard of care is observation and close monitoring. I recommended for the patient to have repeat CT scan of the chest in 1 year for restaging of his disease. For the fatty liver and his other comorbidities, I strongly encouraged the patient to work on weight loss. He was advised to call immediately if he  has any other concerning symptoms in the interval. The patient voices understanding of current disease status and treatment options and is in agreement with the current care plan. All questions were answered. The patient knows to call the clinic with any problems, questions or concerns. We can certainly see the patient much sooner if necessary.  Thank you so much for allowing me to participate in the care of Derek Fitzgerald. I will continue to follow up the patient with you and assist in his care. The total time spent in the appointment was 60 minutes.  Disclaimer: This note was dictated with voice recognition software. Similar sounding words can inadvertently be transcribed and may not be corrected upon review.   Derek Fitzgerald August 29, 2021, 1:46 PM

## 2021-09-30 ENCOUNTER — Telehealth: Payer: Self-pay | Admitting: Internal Medicine

## 2021-09-30 ENCOUNTER — Ambulatory Visit: Payer: BC Managed Care – PPO

## 2021-09-30 DIAGNOSIS — G4733 Obstructive sleep apnea (adult) (pediatric): Secondary | ICD-10-CM | POA: Diagnosis not present

## 2021-09-30 MED ORDER — FLUTICASONE-SALMETEROL 230-21 MCG/ACT IN AERO: 2.0000 | INHALATION_SPRAY | Freq: Two times a day (BID) | RESPIRATORY_TRACT | 3 refills | Status: DC

## 2021-09-30 NOTE — Telephone Encounter (Signed)
ATC patient's phone but the line was busy. Called patient's mother susie. Susie verified pharmacy. Rx for advair has been sent to the patients pharmacy.  Nothing further needed.

## 2021-10-04 DIAGNOSIS — G4733 Obstructive sleep apnea (adult) (pediatric): Secondary | ICD-10-CM | POA: Diagnosis not present

## 2021-10-07 ENCOUNTER — Telehealth: Payer: Self-pay | Admitting: Internal Medicine

## 2021-10-07 ENCOUNTER — Encounter: Payer: Self-pay | Admitting: Internal Medicine

## 2021-10-07 DIAGNOSIS — G4733 Obstructive sleep apnea (adult) (pediatric): Secondary | ICD-10-CM

## 2021-10-07 NOTE — Telephone Encounter (Signed)
-----   Message from Donne Hazel sent at 10/04/2021  2:26 PM EDT ----- [place],  This patient's hst report is ready for review

## 2021-10-07 NOTE — Telephone Encounter (Signed)
Sleep study consistent with severe sleep apnea. He would benefit from CPAP therapy.   Please prescribe Auto CPAP 5-15 cm H20 with medium face mask, supplies and tubing. He already has a follow up scheduled with me.

## 2021-10-12 NOTE — Telephone Encounter (Signed)
Called and spoke to patient and went over recommendations from Dr Shearon Stalls. Patient agreed to cpap therapy. Order placed. Follow up already in place. Nothing further needed

## 2021-11-17 ENCOUNTER — Other Ambulatory Visit: Payer: Self-pay | Admitting: Family Medicine

## 2021-11-17 NOTE — Telephone Encounter (Signed)
Requested Prescriptions  Pending Prescriptions Disp Refills  . atorvastatin (LIPITOR) 20 MG tablet [Pharmacy Med Name: ATORVASTATIN 20 MG TABLET] 90 tablet 0    Sig: TAKE 1 TABLET BY MOUTH EVERY DAY     Cardiovascular:  Antilipid - Statins Failed - 11/17/2021  2:24 AM      Failed - Lipid Panel in normal range within the last 12 months    Cholesterol, Total  Date Value Ref Range Status  06/20/2021 218 (H) 100 - 199 mg/dL Final   LDL Chol Calc (NIH)  Date Value Ref Range Status  06/20/2021 156 (H) 0 - 99 mg/dL Final   HDL  Date Value Ref Range Status  06/20/2021 37 (L) >39 mg/dL Final   Triglycerides  Date Value Ref Range Status  06/20/2021 139 0 - 149 mg/dL Final         Passed - Patient is not pregnant      Passed - Valid encounter within last 12 months    Recent Outpatient Visits          2 months ago Essential hypertension   [facility] at [facility], MD   5 months ago Essential hypertension   Primary Care at [facility], Loraine Grip, PA-C   7 months ago Annual physical exam   Primary Care at [facility], MD   8 months ago History of COVID-19   Primary Care at [facility], Kriste Basque, NP   9 months ago COVID-44   Primary Care at [facility], Kriste Basque, NP      Future Appointments            In 5 days Dorna Mai, MD Primary Care at [facility]   In 2 weeks Spero Geralds, MD [facility]

## 2021-11-21 ENCOUNTER — Other Ambulatory Visit: Payer: Self-pay | Admitting: Family Medicine

## 2021-11-22 ENCOUNTER — Ambulatory Visit: Payer: BC Managed Care – PPO | Admitting: Family Medicine

## 2021-11-29 ENCOUNTER — Ambulatory Visit (INDEPENDENT_AMBULATORY_CARE_PROVIDER_SITE_OTHER): Payer: BC Managed Care – PPO | Admitting: Family Medicine

## 2021-11-29 ENCOUNTER — Encounter: Payer: Self-pay | Admitting: Family Medicine

## 2021-11-29 VITALS — BP 132/88 | HR 92 | Temp 98.1°F | Resp 16 | Wt 239.8 lb

## 2021-11-29 DIAGNOSIS — E6609 Other obesity due to excess calories: Secondary | ICD-10-CM

## 2021-11-29 DIAGNOSIS — Z6833 Body mass index (BMI) 33.0-33.9, adult: Secondary | ICD-10-CM

## 2021-11-29 DIAGNOSIS — I10 Essential (primary) hypertension: Secondary | ICD-10-CM

## 2021-11-29 DIAGNOSIS — F411 Generalized anxiety disorder: Secondary | ICD-10-CM | POA: Diagnosis not present

## 2021-11-29 MED ORDER — LOSARTAN POTASSIUM 100 MG PO TABS: 100.0000 mg | ORAL_TABLET | Freq: Every day | ORAL | 1 refills | Status: DC

## 2021-11-29 MED ORDER — ESCITALOPRAM OXALATE 10 MG PO TABS: 10.0000 mg | ORAL_TABLET | Freq: Every day | ORAL | 1 refills | Status: DC

## 2021-11-29 MED ORDER — MIRTAZAPINE 30 MG PO TABS: 30.0000 mg | ORAL_TABLET | Freq: Every day | ORAL | 1 refills | Status: DC

## 2021-11-29 NOTE — Progress Notes (Signed)
Established Patient Office Visit  Subjective    Patient ID: Derek Fitzgerald, male    DOB: [birth year]  Age: 41 y.o. MRN: [id number]  CC:  Chief Complaint  Patient presents with   Follow-up   Hypertension    HPI Derek Fitzgerald presents for follow up of chronic med issues. Patient reports that he does have delayed ejaculation with lexapro but he knows that meds are working.    Outpatient Encounter Medications as of 11/29/2021  Medication Sig   mirtazapine (REMERON) 30 MG tablet Take 1 tablet (30 mg total) by mouth at bedtime.   acetaminophen (TYLENOL) 325 MG tablet Take 650 mg by mouth every 6 (six) hours as needed for moderate pain.   albuterol (VENTOLIN HFA) 108 (90 Base) MCG/ACT inhaler Inhale 1-2 puffs into the lungs every 6 (six) hours as needed for wheezing or shortness of breath.   atorvastatin (LIPITOR) 20 MG tablet TAKE 1 TABLET BY MOUTH EVERY DAY   buPROPion (WELLBUTRIN XL) 150 MG 24 hr tablet Take 1 tablet (150 mg total) by mouth daily.   CLARITIN 10 MG tablet Take 10 mg by mouth daily as needed for allergies or rhinitis.   FLONASE ALLERGY RELIEF 50 MCG/ACT nasal spray Place 1-2 sprays into both nostrils 2 (two) times daily as needed for allergies or rhinitis.   fluticasone-salmeterol (ADVAIR HFA) 230-21 MCG/ACT inhaler Inhale 2 puffs into the lungs 2 (two) times daily.   losartan (COZAAR) 100 MG tablet Take 1 tablet (100 mg total) by mouth daily.   [DISCONTINUED] escitalopram (LEXAPRO) 10 MG tablet Take 1 tablet (10 mg total) by mouth daily.   [DISCONTINUED] escitalopram (LEXAPRO) 10 MG tablet Take 1 tablet (10 mg total) by mouth daily.   [DISCONTINUED] losartan (COZAAR) 100 MG tablet Take 1 tablet (100 mg total) by mouth daily.   No facility-administered encounter medications on file as of 11/29/2021.    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   Depression    Fatty liver    History of kidney stones    Hypercholesteremia     Past Surgical History:  Procedure Laterality Date   BIOPSY  04/13/2021   Procedure: BIOPSY;  Surgeon: Spero Geralds, MD;  [facility];  Service: Pulmonary;;   BRONCHIAL BRUSHINGS  04/13/2021   Procedure: BRONCHIAL BRUSHINGS;  Surgeon: Spero Geralds, MD;  [facility];  Service: Pulmonary;;   BRONCHIAL WASHINGS  04/13/2021   Procedure: BRONCHIAL WASHINGS;  Surgeon: Spero Geralds, MD;  [facility];  Service: Pulmonary;;   INTERCOSTAL NERVE BLOCK Left 06/13/2021   Procedure: INTERCOSTAL NERVE BLOCK;  Surgeon: Melrose Nakayama, MD;  Location: [place];  Service: Thoracic;  Laterality: Left;   LASER BRONCHOSCOPY N/A 06/13/2021   Procedure: LASER BRONCHOSCOPY;  Surgeon: Melrose Nakayama, MD;  Location: [place];  Service: Thoracic;  Laterality: N/A;   NODE DISSECTION Left 06/13/2021   Procedure: NODE DISSECTION;  Surgeon: Melrose Nakayama, MD;  Location: [place];  Service: Thoracic;  Laterality: Left;   VIDEO BRONCHOSCOPY N/A 04/13/2021   Procedure: VIDEO BRONCHOSCOPY WITHOUT FLUORO;  Surgeon: Spero Geralds, MD;  [facility];  Service: Pulmonary;  Laterality: N/A;   VIDEO BRONCHOSCOPY N/A 06/13/2021   Procedure: VIDEO BRONCHOSCOPY;  Surgeon: Melrose Nakayama, MD;  Location: [place];  Service: Thoracic;  Laterality: N/A;   WISDOM TOOTH EXTRACTION      Family History  Problem Relation Age of Onset   Prostate cancer Father    Asthma Son     Social History   Socioeconomic History   Marital status: Divorced    Spouse name: Not on file   Number of children: Not on file   Years of education: Not on file   Highest education level: Not on file  Occupational History   Not on file  Tobacco Use   Smoking status: Never   Smokeless tobacco: Never  Vaping Use   Vaping Use: Never used  Substance and Sexual Activity   Alcohol use: Yes     Comment: socially   Drug use: Never   Sexual activity: Not Currently  Other Topics Concern   Not on file  Social History Narrative   Not on file   Social Determinants of Health   Financial Resource Strain: Not on file  Food Insecurity: Not on file  Transportation Needs: Not on file  Physical Activity: Not on file  Stress: Not on file  Social Connections: Not on file  Intimate Partner Violence: Not on file    Review of Systems  Psychiatric/Behavioral:  Negative for depression, substance abuse and suicidal ideas. The patient is not nervous/anxious.   All other systems reviewed and are negative.       Objective    BP 132/88   Pulse 92   Temp 98.1 F (36.7 C) (Oral)   Resp 16   Wt 239 lb 12.8 oz (108.8 kg)   SpO2 97%   BMI 33.45 kg/m   Physical Exam Vitals and nursing note reviewed.  Constitutional:      General: He is not in acute distress. Cardiovascular:     Rate and Rhythm: Normal rate and regular rhythm.  Pulmonary:     Effort: Pulmonary effort is normal.     Breath sounds: Normal breath sounds.  Abdominal:     Palpations: Abdomen is soft.     Tenderness: There is no abdominal tenderness.  Neurological:     General: No focal deficit present.     Mental Status: He is alert and oriented to person, place, and time.  Psychiatric:        Mood and Affect: Mood normal.        Behavior: Behavior normal.         Assessment & Plan:   1. Essential hypertension Appears stable. Continue and monitor  2. Class 1 obesity due to excess calories with serious comorbidity and body mass index (BMI) of 33.0 to 33.9 in adult   3. GAD (generalized anxiety disorder) Will d/c lexapro and change to remeron 30 mg 2/2 SE. Continue Wellbutrin     Return in about 4 weeks (around 12/27/2021) for follow up.   Becky Sax, MD

## 2021-12-07 ENCOUNTER — Ambulatory Visit (INDEPENDENT_AMBULATORY_CARE_PROVIDER_SITE_OTHER): Payer: BC Managed Care – PPO | Admitting: Internal Medicine

## 2021-12-07 ENCOUNTER — Encounter: Payer: Self-pay | Admitting: Internal Medicine

## 2021-12-07 VITALS — BP 130/72 | HR 92 | Ht 71.0 in | Wt 246.8 lb

## 2021-12-07 DIAGNOSIS — J301 Allergic rhinitis due to pollen: Secondary | ICD-10-CM | POA: Diagnosis not present

## 2021-12-07 DIAGNOSIS — J4540 Moderate persistent asthma, uncomplicated: Secondary | ICD-10-CM

## 2021-12-07 DIAGNOSIS — G4733 Obstructive sleep apnea (adult) (pediatric): Secondary | ICD-10-CM | POA: Diagnosis not present

## 2021-12-07 MED ORDER — MONTELUKAST SODIUM 10 MG PO TABS: 10.0000 mg | ORAL_TABLET | Freq: Every day | ORAL | 11 refills | Status: DC

## 2021-12-07 NOTE — Patient Instructions (Signed)
Please schedule follow up scheduled with APP in 3 months.    Before your next visit I would like you to have: CPAP titration study - we will schedule this and call you.   Continue advair and albuterol. We will add montelukast for your asthma and allergies.  Please get blood work on your way out today.

## 2021-12-07 NOTE — Progress Notes (Signed)
Derek Fitzgerald    [id number]    [birth year]  Primary Care Physician:Wilson, Clyde Canterbury, MD Date of Appointment: 12/07/2021 Established Patient Visit  Chief complaint:   Chief Complaint  Patient presents with   Follow-up    Follow up for asthma. Pt states a lot of SOB lately started this season. Pt thinks the weather is triggering it. Pt takes advair daily. Pt states he is continuing the cpap but that it is suffocating him at night. Albuterol as needed.      HPI: Derek Fitzgerald is a 41 y.o. man with moderate persistent asthma and endobronchial carcinoid tumor s/p LUL lobectomy with Dr. Roxan Hockey in April 2023  Interval Updates: Here for follow up for asthma and OSA. Here for download review - not able to review one.   Taking advair but misses 2-3 days out of the week.  Not taking albuterol much - keeps in his car/bag. Has used twice in the last week.   No albuterol use. Denies chest tightness, wheezing.  He feels like allergies are a little worse.   Recently switched from lexapro to remeron for depression - making him sleepier.   Current Regimen: albuterol prn, advair 1 puff twice a day.  Asthma Triggers: exertion, talking  Exacerbations in the last year: no prednisone use since Jan 2022 when he went back on advair.  History of hospitalization or intubation: none Allergy Testing: none GERD: denies Allergic Rhinitis: none ACT:  Asthma Control Test ACT Total Score  12/07/2021  4:06 PM 18  08/04/2021  3:27 PM 19  05/03/2021  3:34 PM 18   I have reviewed the patient's family social and past medical history and updated as appropriate.   Past Medical History:  Diagnosis Date   Anxiety    Asthma    Cancer ([place])    Carcinoid tumor of left lung    Endobronchial with obstruction of the left upper lobe bronchus   Depression    Fatty liver    History of kidney stones    Hypercholesteremia     Past Surgical History:  Procedure Laterality Date   BIOPSY  04/13/2021    Procedure: BIOPSY;  Surgeon: Spero Geralds, MD;  [facility];  Service: Pulmonary;;   BRONCHIAL BRUSHINGS  04/13/2021   Procedure: BRONCHIAL BRUSHINGS;  Surgeon: Spero Geralds, MD;  [facility];  Service: Pulmonary;;   BRONCHIAL WASHINGS  04/13/2021   Procedure: BRONCHIAL WASHINGS;  Surgeon: Spero Geralds, MD;  [facility];  Service: Pulmonary;;   INTERCOSTAL NERVE BLOCK Left 06/13/2021   Procedure: INTERCOSTAL NERVE BLOCK;  Surgeon: Melrose Nakayama, MD;  Location: [place];  Service: Thoracic;  Laterality: Left;   LASER BRONCHOSCOPY N/A 06/13/2021   Procedure: LASER BRONCHOSCOPY;  Surgeon: Melrose Nakayama, MD;  Location: [place];  Service: Thoracic;  Laterality: N/A;   NODE DISSECTION Left 06/13/2021   Procedure: NODE DISSECTION;  Surgeon: Melrose Nakayama, MD;  Location: [place];  Service: Thoracic;  Laterality: Left;   VIDEO BRONCHOSCOPY N/A 04/13/2021   Procedure: VIDEO BRONCHOSCOPY WITHOUT FLUORO;  Surgeon: Spero Geralds, MD;  [facility];  Service: Pulmonary;  Laterality: N/A;   VIDEO BRONCHOSCOPY N/A 06/13/2021   Procedure: VIDEO BRONCHOSCOPY;  Surgeon: Melrose Nakayama, MD;  [facility];  Service: Thoracic;  Laterality: N/A;   WISDOM TOOTH EXTRACTION      Family History  Problem Relation Age of Onset   Prostate cancer Father  Asthma Son     Social History   Occupational History   Not on file  Tobacco Use   Smoking status: Never   Smokeless tobacco: Never  Vaping Use   Vaping Use: Never used  Substance and Sexual Activity   Alcohol use: Yes    Comment: socially   Drug use: Never   Sexual activity: Not Currently     Physical Exam: Blood pressure 130/72, pulse 92, height 5\' 11"  (1.803 m), weight 246 lb 12.8 oz (111.9 kg), SpO2 96 %.  Gen:      No acute distress ENT:  no nasal polyps, mucus membranes moist Lungs:    No increased respiratory effort, symmetric chest wall excursion, clear to  auscultation bilaterally, no wheezes or crackles CV:         Regular rate and rhythm; no murmurs, rubs, or gallops.  No pedal edema   Data Reviewed: Imaging: I have personally reviewed the CT Chest which shows lingular atelectasis persistent since October 2022  PFTs:     Latest Ref Rng & Units 04/21/2021    1:06 PM  PFT Results  FVC-Pre L 3.92   FVC-Predicted Pre % 92   FVC-Post L 4.03   FVC-Predicted Post % 94   Pre FEV1/FVC % % 72   Post FEV1/FCV % % 75   FEV1-Pre L 2.83   FEV1-Predicted Pre % 81   FEV1-Post L 3.03    I have personally reviewed the patient's PFTs and normal pulmonary function.   Labs: Lab Results  Component Value Date   WBC 6.5 08/29/2021   HGB 15.4 08/29/2021   HCT 43.7 08/29/2021   MCV 88.5 08/29/2021   PLT 180 08/29/2021   Lab Results  Component Value Date   NA 141 08/29/2021   K 4.0 08/29/2021   CL 108 08/29/2021   CO2 26 08/29/2021    Immunization status:  There is no immunization history on file for this patient.  External Records Personally Reviewed:   Assessment:  Moderate persistent asthma not well controlled Allergic rhinitis LUL endobronchial carcinoid s/p lobectomy OSA on CPAP - download unable to review. Poor control   Plan/Recommendations: continue taking Advair 1 puff twice a day. Continue albuterol as needed.  Needs PAP titration study to further optimize control. May need more optimal settings or mask.  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   Return to Care: Return in about 3 months (around 03/09/2022). With APP I will see him back after that in the spring.   Lenice Llamas, MD Pulmonary and [place]

## 2021-12-09 LAB — RESPIRATORY ALLERGY PROFILE REGION II ~~LOC~~
Allergen, A. alternata, m6: <0.1 kU/L
Allergen, Cedar tree, t12: 0.27 kU/L — ABNORMAL HIGH
Allergen, Comm Silver Birch, t9: 0.27 kU/L — ABNORMAL HIGH
Allergen, Cottonwood, t14: <0.1 kU/L
Allergen, D pternoyssinus,d7: <0.1 kU/L
Allergen, Mouse Urine Protein, e78: <0.1 kU/L
Allergen, Mulberry, t76: <0.1 kU/L
Allergen, Oak,t7: 0.76 kU/L — ABNORMAL HIGH
Allergen, P. notatum, m1: <0.1 kU/L
Aspergillus fumigatus, m3: <0.1 kU/L
Bermuda Grass: <0.1 kU/L
Box Elder IgE: 0.28 kU/L — ABNORMAL HIGH
CLADOSPORIUM HERBARUM (M2) IGE: <0.1 kU/L
COMMON RAGWEED (SHORT) (W1) IGE: <0.1 kU/L
Cat Dander: 1.02 kU/L — ABNORMAL HIGH
Class: 0
Class: 0
Class: 0
Class: 0
Class: 0
Class: 0
Class: 0
Class: 0
Class: 0
Class: 0
Class: 0
Class: 0
Class: 0
Class: 0
Class: 0
Class: 0
Class: 0
Class: 2
Class: 2
Class: 2
Cockroach: <0.1 kU/L
D. farinae: <0.1 kU/L
Dog Dander: 0.21 kU/L — ABNORMAL HIGH
Elm IgE: <0.1 kU/L
IgE (Immunoglobulin E), Serum: 175 kU/L — ABNORMAL HIGH (ref <=114)
Johnson Grass: <0.1 kU/L
Pecan/Hickory Tree IgE: 0.76 kU/L — ABNORMAL HIGH
Rough Pigweed  IgE: <0.1 kU/L
Sheep Sorrel IgE: <0.1 kU/L
Timothy Grass: <0.1 kU/L

## 2021-12-09 LAB — INTERPRETATION:

## 2021-12-26 ENCOUNTER — Other Ambulatory Visit: Payer: Self-pay | Admitting: Family Medicine

## 2021-12-31 ENCOUNTER — Other Ambulatory Visit: Payer: Self-pay | Admitting: Family Medicine

## 2021-12-31 DIAGNOSIS — F411 Generalized anxiety disorder: Secondary | ICD-10-CM

## 2022-01-02 ENCOUNTER — Ambulatory Visit: Payer: BC Managed Care – PPO | Admitting: Family Medicine

## 2022-01-02 NOTE — Telephone Encounter (Signed)
Requested medications are due for refill today.  yes  Requested medications are on the active medications list.  yes  Last refill. 08/22/2021 #90 0 rf  Future visit scheduled.   yes  Notes to clinic.  Abnormal labs.    Requested Prescriptions  Pending Prescriptions Disp Refills   buPROPion (WELLBUTRIN XL) 150 MG 24 hr tablet [Pharmacy Med Name: BUPROPION HCL XL 150 MG TABLET] 90 tablet 0    Sig: TAKE 1 TABLET BY MOUTH EVERY DAY     Psychiatry: Antidepressants - bupropion Failed - 12/31/2021  9:57 AM      Failed - ALT in normal range and within 360 days    ALT  Date Value Ref Range Status  08/29/2021 106 (H) 0 - 44 U/L Final         Passed - Cr in normal range and within 360 days    Creatinine  Date Value Ref Range Status  08/29/2021 1.23 0.61 - 1.24 mg/dL Final         Passed - AST in normal range and within 360 days    AST  Date Value Ref Range Status  08/29/2021 36 15 - 41 U/L Final         Passed - Last BP in normal range    BP Readings from Last 1 Encounters:  12/07/21 130/72         Passed - Valid encounter within last 6 months    Recent Outpatient Visits           1 month ago Essential hypertension   Primary Care at [facility], MD   4 months ago Essential hypertension   Primary Care at [facility], MD   6 months ago Essential hypertension   Primary Care at [facility], Loraine Grip, PA-C   8 months ago Annual physical exam   Primary Care at [facility], MD   9 months ago History of COVID-19   Primary Care at [facility], Kriste Basque, NP       Future Appointments             Today Dorna Mai, MD Primary Care at [facility]   In 1 week Dorna Mai, MD Primary Care at [facility]   In 2 months Belenda Cruise, Karie Schwalbe, NP [facility]

## 2022-01-09 ENCOUNTER — Ambulatory Visit: Payer: BC Managed Care – PPO | Admitting: Family Medicine

## 2022-01-24 ENCOUNTER — Other Ambulatory Visit: Payer: Self-pay | Admitting: Family Medicine

## 2022-02-01 ENCOUNTER — Ambulatory Visit (INDEPENDENT_AMBULATORY_CARE_PROVIDER_SITE_OTHER): Payer: BC Managed Care – PPO | Admitting: Family Medicine

## 2022-02-01 VITALS — BP 132/86 | HR 88 | Temp 98.1°F | Resp 16 | Wt 255.6 lb

## 2022-02-01 DIAGNOSIS — Z6835 Body mass index (BMI) 35.0-35.9, adult: Secondary | ICD-10-CM

## 2022-02-01 DIAGNOSIS — E6609 Other obesity due to excess calories: Secondary | ICD-10-CM

## 2022-02-01 DIAGNOSIS — F5105 Insomnia due to other mental disorder: Secondary | ICD-10-CM | POA: Diagnosis not present

## 2022-02-01 DIAGNOSIS — I10 Essential (primary) hypertension: Secondary | ICD-10-CM

## 2022-02-01 DIAGNOSIS — F411 Generalized anxiety disorder: Secondary | ICD-10-CM | POA: Diagnosis not present

## 2022-02-01 DIAGNOSIS — E782 Mixed hyperlipidemia: Secondary | ICD-10-CM

## 2022-02-01 DIAGNOSIS — F99 Mental disorder, not otherwise specified: Secondary | ICD-10-CM

## 2022-02-01 MED ORDER — ATORVASTATIN CALCIUM 20 MG PO TABS: 20.0000 mg | ORAL_TABLET | Freq: Every day | ORAL | 1 refills | Status: DC

## 2022-02-01 MED ORDER — LOSARTAN POTASSIUM 100 MG PO TABS: 100.0000 mg | ORAL_TABLET | Freq: Every day | ORAL | 1 refills | Status: DC

## 2022-02-01 MED ORDER — MIRTAZAPINE 30 MG PO TABS: 30.0000 mg | ORAL_TABLET | Freq: Every day | ORAL | 1 refills | Status: DC

## 2022-02-02 ENCOUNTER — Encounter: Payer: Self-pay | Admitting: Family Medicine

## 2022-02-02 NOTE — Progress Notes (Signed)
Established Patient Office Visit  Subjective    Patient ID: Derek Fitzgerald, male    DOB: [birth year]  Age: 41 y.o. MRN: [id number]  CC:  Chief Complaint  Patient presents with   Follow-up   Hypertension    HPI Derek Fitzgerald presents for routine follow up of chronic issues.Patient denies acute complaints.     Outpatient Encounter Medications as of 02/01/2022  Medication Sig   acetaminophen (TYLENOL) 325 MG tablet Take 650 mg by mouth every 6 (six) hours as needed for moderate pain.   albuterol (VENTOLIN HFA) 108 (90 Base) MCG/ACT inhaler Inhale 1-2 puffs into the lungs every 6 (six) hours as needed for wheezing or shortness of breath.   buPROPion (WELLBUTRIN XL) 150 MG 24 hr tablet TAKE 1 TABLET BY MOUTH EVERY DAY   CLARITIN 10 MG tablet Take 10 mg by mouth daily as needed for allergies or rhinitis.   FLONASE ALLERGY RELIEF 50 MCG/ACT nasal spray Place 1-2 sprays into both nostrils 2 (two) times daily as needed for allergies or rhinitis.   fluticasone-salmeterol (ADVAIR HFA) 230-21 MCG/ACT inhaler Inhale 2 puffs into the lungs 2 (two) times daily.   montelukast (SINGULAIR) 10 MG tablet Take 1 tablet (10 mg total) by mouth at bedtime.   [DISCONTINUED] atorvastatin (LIPITOR) 20 MG tablet TAKE 1 TABLET BY MOUTH EVERY DAY   [DISCONTINUED] losartan (COZAAR) 100 MG tablet Take 1 tablet (100 mg total) by mouth daily.   [DISCONTINUED] mirtazapine (REMERON) 30 MG tablet TAKE 1 TABLET BY MOUTH AT BEDTIME.   atorvastatin (LIPITOR) 20 MG tablet Take 1 tablet (20 mg total) by mouth daily.   losartan (COZAAR) 100 MG tablet Take 1 tablet (100 mg total) by mouth daily.   mirtazapine (REMERON) 30 MG tablet Take 1 tablet (30 mg total) by mouth at bedtime.   No facility-administered encounter medications on file as of 02/01/2022.    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  bronchus   Depression    Fatty liver    History of kidney stones    Hypercholesteremia     Past Surgical History:  Procedure Laterality Date   BIOPSY  04/13/2021   Procedure: BIOPSY;  Surgeon: Spero Geralds, MD;  [facility];  Service: Pulmonary;;   BRONCHIAL BRUSHINGS  04/13/2021   Procedure: BRONCHIAL BRUSHINGS;  Surgeon: Spero Geralds, MD;  [facility];  Service: Pulmonary;;   BRONCHIAL WASHINGS  04/13/2021   Procedure: BRONCHIAL WASHINGS;  Surgeon: Spero Geralds, MD;  [facility];  Service: Pulmonary;;   INTERCOSTAL NERVE BLOCK Left 06/13/2021   Procedure: INTERCOSTAL NERVE BLOCK;  Surgeon: Melrose Nakayama, MD;  Location: [place];  Service: Thoracic;  Laterality: Left;   LASER BRONCHOSCOPY N/A 06/13/2021   Procedure: LASER BRONCHOSCOPY;  Surgeon: Melrose Nakayama, MD;  Location: [place];  Service: Thoracic;  Laterality: N/A;   NODE DISSECTION Left 06/13/2021   Procedure: NODE DISSECTION;  Surgeon: Melrose Nakayama, MD;  Location: [place];  Service: Thoracic;  Laterality: Left;   VIDEO BRONCHOSCOPY N/A 04/13/2021   Procedure: VIDEO BRONCHOSCOPY WITHOUT FLUORO;  Surgeon: Spero Geralds, MD;  [facility];  Service: Pulmonary;  Laterality: N/A;   VIDEO BRONCHOSCOPY N/A 06/13/2021   Procedure: VIDEO BRONCHOSCOPY;  Surgeon: Melrose Nakayama, MD;  Location: [place];  Service: Thoracic;  Laterality: N/A;   WISDOM TOOTH EXTRACTION      Family History  Problem Relation Age of Onset   Prostate cancer Father    Asthma Son     Social History   Socioeconomic History   Marital status: Divorced    Spouse name: Not on file   Number of children: Not on file   Years of education: Not on file   Highest education level: Not on file  Occupational History   Not on file  Tobacco Use   Smoking status: Never   Smokeless tobacco: Never  Vaping Use   Vaping Use: Never used  Substance and Sexual Activity   Alcohol use: Yes     Comment: socially   Drug use: Never   Sexual activity: Not Currently  Other Topics Concern   Not on file  Social History Narrative   Not on file   Social Determinants of Health   Financial Resource Strain: Not on file  Food Insecurity: Not on file  Transportation Needs: Not on file  Physical Activity: Not on file  Stress: Not on file  Social Connections: Not on file  Intimate Partner Violence: Not on file    Review of Systems  Psychiatric/Behavioral:  Negative for depression, substance abuse and suicidal ideas. The patient is not nervous/anxious.   All other systems reviewed and are negative.       Objective    BP 132/86   Pulse 88   Temp 98.1 F (36.7 C) (Oral)   Resp 16   Wt 255 lb 9.6 oz (115.9 kg)   SpO2 93%   BMI 35.65 kg/m   Physical Exam Vitals and nursing note reviewed.  Constitutional:      General: He is not in acute distress. Cardiovascular:     Rate and Rhythm: Normal rate and regular rhythm.  Pulmonary:     Effort: Pulmonary effort is normal.     Breath sounds: Normal breath sounds.  Abdominal:     Palpations: Abdomen is soft.     Tenderness: There is no abdominal tenderness.  Neurological:     General: No focal deficit present.     Mental Status: He is alert and oriented to person, place, and time.  Psychiatric:        Mood and Affect: Mood normal.        Behavior: Behavior normal.         Assessment & Plan:   1. Essential hypertension Appears stable. Continue   2. Class 2 severe obesity due to excess calories with serious comorbidity and body mass index (BMI) of 35.0 to 35.9 in adult [facility]) Discussed dietary and activity options.   3. Anxiety state Continue remeron 30 mg daily  4. Insomnia due to other mental disorder As above  5. Mixed hyperlipidemia Continue. Meds refilled.     No follow-ups on file.   Becky Sax, MD

## 2022-02-07 ENCOUNTER — Ambulatory Visit (INDEPENDENT_AMBULATORY_CARE_PROVIDER_SITE_OTHER): Payer: BC Managed Care – PPO | Admitting: Thoracic Surgery (Cardiothoracic Vascular Surgery)

## 2022-02-07 ENCOUNTER — Encounter: Payer: Self-pay | Admitting: Thoracic Surgery (Cardiothoracic Vascular Surgery)

## 2022-02-07 VITALS — BP 115/78 | HR 83 | Resp 20 | Ht 71.0 in | Wt 255.0 lb

## 2022-02-07 DIAGNOSIS — Z09 Encounter for follow-up examination after completed treatment for conditions other than malignant neoplasm: Secondary | ICD-10-CM

## 2022-02-07 NOTE — Progress Notes (Signed)
[address]       Derek Fitzgerald,Derek Fitzgerald [id number]             [phone]     HPI: [name] returns for follow-up after previous left upper lobectomy for carcinoid tumor.  Derek Fitzgerald is a 41 year old male with a history of asthma, hyperlipidemia, and carcinoid tumor of the left upper lobe.  He presented with cough, shortness of breath and wheezing.  Chest x-ray showed a lingular opacity.  CT showed a near complete atelectasis of the left upper lobe.  Bronchoscopy revealed an endobronchial mass.  I did a robotic assisted left upper lobectomy in April 2023.  Pathology showed a stage Ib (T2, N0) low-grade neuroendocrine tumor.  Postoperative course was uncomplicated.  I last saw him in the office in June.  He was doing well at that time.  In the interim since then he saw Dr. Julien Nordmann.  No adjuvant therapy was recommended.  Plan is to do a CT in 1 year.  He feels well.  He has noted a little bit of shortness of breath when it is very cold outside.  Rarely will have a pain in the left chest area.  Past Medical History:  Diagnosis Date   Anxiety    Asthma    Cancer ([place])    Carcinoid tumor of left lung    Endobronchial with obstruction of the left upper lobe bronchus   Depression    Fatty liver    History of kidney stones    Hypercholesteremia     Current Outpatient Medications  Medication Sig Dispense Refill   acetaminophen (TYLENOL) 325 MG tablet Take 650 mg by mouth every 6 (six) hours as needed for moderate pain.     albuterol (VENTOLIN HFA) 108 (90 Base) MCG/ACT inhaler Inhale 1-2 puffs into the lungs every 6 (six) hours as needed for wheezing or shortness of breath. 1 each 5   atorvastatin (LIPITOR) 20 MG tablet Take 1 tablet (20 mg total) by mouth daily. 90 tablet 1   buPROPion (WELLBUTRIN XL) 150 MG 24 hr tablet TAKE 1 TABLET BY MOUTH EVERY DAY 90 tablet 0   CLARITIN 10 MG tablet Take 10 mg by mouth daily as needed for allergies or rhinitis.     FLONASE ALLERGY  RELIEF 50 MCG/ACT nasal spray Place 1-2 sprays into both nostrils 2 (two) times daily as needed for allergies or rhinitis.     fluticasone-salmeterol (ADVAIR HFA) 230-21 MCG/ACT inhaler Inhale 2 puffs into the lungs 2 (two) times daily. 1 each 3   losartan (COZAAR) 100 MG tablet Take 1 tablet (100 mg total) by mouth daily. 90 tablet 1   mirtazapine (REMERON) 30 MG tablet Take 1 tablet (30 mg total) by mouth at bedtime. 90 tablet 1   montelukast (SINGULAIR) 10 MG tablet Take 1 tablet (10 mg total) by mouth at bedtime. 30 tablet 11   No current facility-administered medications for this visit.    Physical Exam BP 115/78   Pulse 83   Resp 20   Ht 5\' 11"  (1.803 m)   Wt 255 lb (115.7 kg)   SpO2 97% Comment: RA  BMI 35.55 kg/m  41 year old man in no acute distress Alert and oriented x 3 with no focal deficits Lungs slightly diminished at left base but otherwise clear bilaterally Cardiac regular rate and rhythm Incisions well-healed   Impression: Derek Fitzgerald is a 41 year old man who had a left upper lobectomy for a central carcinoid tumor  in April 2023.  Now about 8 months out from surgery and is doing well with no residual ill effects.  He has a follow-up with Dr. Julien Nordmann with a CT scan in 1 year.  Plan: I will be happy to see [name] back at anytime in the future if I can be of any further assistance with his care  Melrose Nakayama, MD [facility] [phone]

## 2022-02-09 ENCOUNTER — Encounter (HOSPITAL_BASED_OUTPATIENT_CLINIC_OR_DEPARTMENT_OTHER): Payer: BC Managed Care – PPO | Admitting: Internal Medicine

## 2022-03-06 ENCOUNTER — Ambulatory Visit (HOSPITAL_BASED_OUTPATIENT_CLINIC_OR_DEPARTMENT_OTHER): Payer: BC Managed Care – PPO | Attending: Internal Medicine | Admitting: Internal Medicine

## 2022-03-06 VITALS — Ht 70.0 in | Wt 250.0 lb

## 2022-03-06 DIAGNOSIS — G4733 Obstructive sleep apnea (adult) (pediatric): Secondary | ICD-10-CM | POA: Diagnosis present

## 2022-03-09 ENCOUNTER — Ambulatory Visit: Payer: BC Managed Care – PPO | Admitting: Nurse Practitioner

## 2022-03-11 DIAGNOSIS — G4733 Obstructive sleep apnea (adult) (pediatric): Secondary | ICD-10-CM | POA: Diagnosis not present

## 2022-03-11 NOTE — Procedures (Signed)
    Patient Name: Derek Fitzgerald, Fogleman Date: 03/06/2022 Gender: Male D.O.B: [birth year] Age (years): 41 Referring Provider: Durel Salts MD Height (inches): 70 Interpreting Physician: Jetty Duhamel MD, ABSM Weight (lbs): 250 RPSGT: [place] Sink BMI: 36 MRN: [id number] Neck Size: 16.50  CLINICAL INFORMATION The patient is referred for a CPAP titration to treat sleep apnea.  Date of NPSG, Split Night or HST: HST 10/03/21  AHI 29.7/ hr, desaturation to 86%, body weight 244 lbs  SLEEP STUDY TECHNIQUE As per the AASM Manual for the Scoring of Sleep and Associated Events v2.3 (April 2016) with a hypopnea requiring 4% desaturations.  The channels recorded and monitored were frontal, central and occipital EEG, electrooculogram (EOG), submentalis EMG (chin), nasal and oral airflow, thoracic and abdominal wall motion, anterior tibialis EMG, snore microphone, electrocardiogram, and pulse oximetry. Continuous positive airway pressure (CPAP) was initiated at the beginning of the study and titrated to treat sleep-disordered breathing.  MEDICATIONS Medications self-administered by patient taken the night of the study : none reported  TECHNICIAN COMMENTS Comments added by technician: None Comments added by scorer: N/A  RESPIRATORY PARAMETERS Optimal PAP Pressure (cm): 14 AHI at Optimal Pressure (/hr):0/hr Overall Minimal O2 (%): 89.0 Supine % at Optimal Pressure (%): N/A Minimal O2 at Optimal Pressure (%)94   SLEEP ARCHITECTURE The study was initiated at 8:49:00 AM and ended at 2:55:43 PM.  Sleep onset time was 4.0 minutes and the sleep efficiency was 90.3%. The total sleep time was 331 minutes.  The patient spent 10.4% of the night in stage N1 sleep, 61.6% in stage N2 sleep, 0.0% in stage N3 and 28% in REM.Stage REM latency was 34.0 minutes  Wake after sleep onset was 31.7. Alpha intrusion was absent. Supine sleep was 100.00%.  CARDIAC DATA The 2 lead EKG demonstrated sinus rhythm.  The mean heart rate was 81.7 beats per minute. Other EKG findings include: None.  LEG MOVEMENT DATA The total Periodic Limb Movements of Sleep (PLMS) were 0. The PLMS index was 0.0. A PLMS index of <15 is considered normal in adults.  IMPRESSIONS - An optimal PAP pressure was identified 14 cwp. - Mild oxygen desaturations were observed during this titration (min O2 = 89.0%). Minimum O2 saturation with CPAP 14 was 94%. - The patient snored with moderate snoring volume during this titration study. - No cardiac abnormalities were observed during this study. - Clinically significant periodic limb movements were not noted during this study. Arousals associated with PLMs were rare.  DIAGNOSIS - Obstructive Sleep Apnea (G47.33)  RECOMMENDATIONS - Suggest CPAP 14 or autopap 10-20. - Patient used a small-medium F&P Evora full face mask with heated humidity. - Be careful with alcohol, sedatives and other CNS depressants that may worsen sleep apnea and disrupt normal sleep architecture. - Sleep hygiene should be reviewed to assess factors that may improve sleep quality. - Weight management and regular exercise should be initiated or continued.  [Electronically signed] 03/11/2022 11:22 AM  Jetty Duhamel MD, ABSM Diplomate, American Board of Sleep Medicine NPI: [id number]                         Jetty Duhamel Diplomate, American Board of Sleep Medicine  ELECTRONICALLY SIGNED ON:  03/11/2022, 11:16 AM [facility] PH: [phone]   FX: [phone] ACCREDITED BY THE AMERICAN ACADEMY OF SLEEP MEDICINE

## 2022-04-08 ENCOUNTER — Other Ambulatory Visit: Payer: Self-pay | Admitting: Family Medicine

## 2022-04-08 DIAGNOSIS — F411 Generalized anxiety disorder: Secondary | ICD-10-CM

## 2022-05-10 ENCOUNTER — Encounter: Payer: BC Managed Care – PPO | Admitting: Family Medicine

## 2022-05-17 ENCOUNTER — Encounter: Payer: Self-pay | Admitting: Family Medicine

## 2022-05-17 ENCOUNTER — Ambulatory Visit (INDEPENDENT_AMBULATORY_CARE_PROVIDER_SITE_OTHER): Payer: BC Managed Care – PPO | Admitting: Family Medicine

## 2022-05-17 VITALS — BP 131/89 | HR 93 | Temp 98.1°F | Resp 16 | Ht 69.0 in | Wt 260.6 lb

## 2022-05-17 DIAGNOSIS — Z130 Encounter for screening for diseases of the blood and blood-forming organs and certain disorders involving the immune mechanism: Secondary | ICD-10-CM | POA: Diagnosis not present

## 2022-05-17 DIAGNOSIS — F411 Generalized anxiety disorder: Secondary | ICD-10-CM

## 2022-05-17 DIAGNOSIS — Z0000 Encounter for general adult medical examination without abnormal findings: Secondary | ICD-10-CM

## 2022-05-17 DIAGNOSIS — Z13220 Encounter for screening for lipoid disorders: Secondary | ICD-10-CM | POA: Diagnosis not present

## 2022-05-17 DIAGNOSIS — E6609 Other obesity due to excess calories: Secondary | ICD-10-CM | POA: Diagnosis not present

## 2022-05-17 DIAGNOSIS — Z6838 Body mass index (BMI) 38.0-38.9, adult: Secondary | ICD-10-CM

## 2022-05-17 MED ORDER — ESCITALOPRAM OXALATE 10 MG PO TABS: 10.0000 mg | ORAL_TABLET | Freq: Every day | ORAL | 1 refills | Status: DC

## 2022-05-18 LAB — CMP14+EGFR
ALT: 127 IU/L — ABNORMAL HIGH (ref 0–44)
AST: 57 IU/L — ABNORMAL HIGH (ref 0–40)
Albumin/Globulin Ratio: 1.8 (ref 1.2–2.2)
Albumin: 4.6 g/dL (ref 4.1–5.1)
Alkaline Phosphatase: 96 IU/L (ref 44–121)
BUN/Creatinine Ratio: 10 (ref 9–20)
BUN: 11 mg/dL (ref 6–24)
Bilirubin Total: 1 mg/dL (ref 0.0–1.2)
CO2: 21 mmol/L (ref 20–29)
Calcium: 9.2 mg/dL (ref 8.7–10.2)
Chloride: 103 mmol/L (ref 96–106)
Creatinine, Ser: 1.12 mg/dL (ref 0.76–1.27)
Globulin, Total: 2.6 g/dL (ref 1.5–4.5)
Glucose: 84 mg/dL (ref 70–99)
Potassium: 4.5 mmol/L (ref 3.5–5.2)
Sodium: 140 mmol/L (ref 134–144)
Total Protein: 7.2 g/dL (ref 6.0–8.5)
eGFR: 85 mL/min/{1.73_m2} (ref >59)

## 2022-05-18 LAB — CBC WITH DIFFERENTIAL/PLATELET
Basophils Absolute: 0.1 10*3/uL (ref 0.0–0.2)
Basos: 1 %
EOS (ABSOLUTE): 0.4 10*3/uL (ref 0.0–0.4)
Eos: 6 %
Hematocrit: 47.1 % (ref 37.5–51.0)
Hemoglobin: 16 g/dL (ref 13.0–17.7)
Immature Grans (Abs): 0 10*3/uL (ref 0.0–0.1)
Immature Granulocytes: 0 %
Lymphocytes Absolute: 3.3 10*3/uL — ABNORMAL HIGH (ref 0.7–3.1)
Lymphs: 46 %
MCH: 30 pg (ref 26.6–33.0)
MCHC: 34 g/dL (ref 31.5–35.7)
MCV: 88 fL (ref 79–97)
Monocytes Absolute: 0.8 10*3/uL (ref 0.1–0.9)
Monocytes: 11 %
Neutrophils Absolute: 2.6 10*3/uL (ref 1.4–7.0)
Neutrophils: 36 %
RBC: 5.34 x10E6/uL (ref 4.14–5.80)
RDW: 12.9 % (ref 11.6–15.4)
WBC: 7.1 10*3/uL (ref 3.4–10.8)

## 2022-05-18 LAB — LIPID PANEL
Chol/HDL Ratio: 4.4 ratio (ref 0.0–5.0)
Cholesterol, Total: 175 mg/dL (ref 100–199)
HDL: 40 mg/dL (ref >39)
LDL Chol Calc (NIH): 104 mg/dL — ABNORMAL HIGH (ref 0–99)
Triglycerides: 179 mg/dL — ABNORMAL HIGH (ref 0–149)
VLDL Cholesterol Cal: 31 mg/dL (ref 5–40)

## 2022-05-23 ENCOUNTER — Encounter: Payer: Self-pay | Admitting: Family Medicine

## 2022-05-23 NOTE — Progress Notes (Signed)
Established Patient Office Visit  Subjective    Patient ID: Derek Fitzgerald, male    DOB: [birth year]  Age: 42 y.o. MRN: [id number]  CC:  Chief Complaint  Patient presents with   Annual Exam    HPI [name] presents for routine annual exam.    Outpatient Encounter Medications as of 05/17/2022  Medication Sig   escitalopram (LEXAPRO) 10 MG tablet Take 1 tablet (10 mg total) by mouth daily.   rosuvastatin (CRESTOR) 20 MG tablet Take by mouth.   acetaminophen (TYLENOL) 325 MG tablet Take 650 mg by mouth every 6 (six) hours as needed for moderate pain.   albuterol (VENTOLIN HFA) 108 (90 Base) MCG/ACT inhaler Inhale 1-2 puffs into the lungs every 6 (six) hours as needed for wheezing or shortness of breath.   atorvastatin (LIPITOR) 20 MG tablet Take 1 tablet (20 mg total) by mouth daily.   buPROPion (WELLBUTRIN XL) 150 MG 24 hr tablet TAKE 1 TABLET BY MOUTH EVERY DAY   CLARITIN 10 MG tablet Take 10 mg by mouth daily as needed for allergies or rhinitis.   FLONASE ALLERGY RELIEF 50 MCG/ACT nasal spray Place 1-2 sprays into both nostrils 2 (two) times daily as needed for allergies or rhinitis.   fluticasone-salmeterol (ADVAIR HFA) 230-21 MCG/ACT inhaler Inhale 2 puffs into the lungs 2 (two) times daily.   losartan (COZAAR) 100 MG tablet Take 1 tablet (100 mg total) by mouth daily.   montelukast (SINGULAIR) 10 MG tablet Take 1 tablet (10 mg total) by mouth at bedtime.   [DISCONTINUED] mirtazapine (REMERON) 30 MG tablet Take 1 tablet (30 mg total) by mouth at bedtime.   No facility-administered encounter medications on file as of 05/17/2022.    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   Depression    Fatty liver    History of kidney stones    Hypercholesteremia     Past Surgical History:  Procedure Laterality Date   BIOPSY  04/13/2021    Procedure: BIOPSY;  Surgeon: Spero Geralds, MD;  Location: [facility];  Service: Pulmonary;;   BRONCHIAL BRUSHINGS  04/13/2021   Procedure: BRONCHIAL BRUSHINGS;  Surgeon: Spero Geralds, MD;  Location: [facility];  Service: Pulmonary;;   BRONCHIAL WASHINGS  04/13/2021   Procedure: BRONCHIAL WASHINGS;  Surgeon: Spero Geralds, MD;  Location: [facility];  Service: Pulmonary;;   INTERCOSTAL NERVE BLOCK Left 06/13/2021   Procedure: INTERCOSTAL NERVE BLOCK;  Surgeon: Melrose Nakayama, MD;  Location: [place];  Service: Thoracic;  Laterality: Left;   LASER BRONCHOSCOPY N/A 06/13/2021   Procedure: LASER BRONCHOSCOPY;  Surgeon: Melrose Nakayama, MD;  Location: [place] [facility];  Service: Thoracic;  Laterality: N/A;   NODE DISSECTION Left 06/13/2021   Procedure: NODE DISSECTION;  Surgeon: Melrose Nakayama, MD;  Location: [facility];  Service: Thoracic;  Laterality: Left;   VIDEO BRONCHOSCOPY N/A 04/13/2021   Procedure: VIDEO BRONCHOSCOPY WITHOUT FLUORO;  Surgeon: Spero Geralds, MD;  Location: [facility];  Service: Pulmonary;  Laterality: N/A;   VIDEO BRONCHOSCOPY N/A 06/13/2021   Procedure: VIDEO BRONCHOSCOPY;  Surgeon: Melrose Nakayama, MD;  Location[facility];  Service: Thoracic;  Laterality: N/A;   WISDOM TOOTH EXTRACTION      Family History  Problem Relation Age of Onset   Prostate cancer Father  Asthma Son     Social History   Socioeconomic History   Marital status: Divorced    Spouse name: Not on file   Number of children: Not on file   Years of education: Not on file   Highest education level: Not on file  Occupational History   Not on file  Tobacco Use   Smoking status: Never   Smokeless tobacco: Never  Vaping Use   Vaping Use: Never used  Substance and Sexual Activity   Alcohol use: Yes    Comment: socially   Drug use: Never   Sexual activity: Not Currently  Other Topics Concern   Not on file  Social History Narrative   Not on file   Social Determinants of  Health   Financial Resource Strain: Not on file  Food Insecurity: Not on file  Transportation Needs: Not on file  Physical Activity: Not on file  Stress: Not on file  Social Connections: Not on file  Intimate Partner Violence: Not on file    Review of Systems  Psychiatric/Behavioral:  Negative for suicidal ideas. The patient is nervous/anxious.   All other systems reviewed and are negative.       Objective    BP 131/89   Pulse 93   Temp 98.1 F (36.7 C) (Oral)   Resp 16   Ht 5\' 9"  (1.753 m)   Wt 260 lb 9.6 oz (118.2 kg)   SpO2 94%   BMI 38.48 kg/m   Physical Exam Vitals and nursing note reviewed.  Constitutional:      General: He is not in acute distress.    Appearance: He is obese.  HENT:     Head: Normocephalic and atraumatic.     Right Ear: Tympanic membrane, ear canal and external ear normal.     Left Ear: Tympanic membrane, ear canal and external ear normal.     Nose: Nose normal.     Mouth/Throat:     Mouth: Mucous membranes are moist.     Pharynx: Oropharynx is clear.  Eyes:     Conjunctiva/sclera: Conjunctivae normal.     Pupils: Pupils are equal, round, and reactive to light.  Neck:     Thyroid: No thyromegaly.  Cardiovascular:     Rate and Rhythm: Normal rate and regular rhythm.     Heart sounds: Normal heart sounds. No murmur heard. Pulmonary:     Effort: Pulmonary effort is normal.     Breath sounds: Normal breath sounds.  Abdominal:     General: There is no distension.     Palpations: Abdomen is soft. There is no mass.     Tenderness: There is no abdominal tenderness.     Hernia: There is no hernia in the left inguinal area or right inguinal area.  Genitourinary:    Penis: Normal and circumcised.      Testes: Normal.  Musculoskeletal:        General: Normal range of motion.     Cervical back: Normal range of motion and neck supple.     Right lower leg: No edema.     Left lower leg: No edema.  Skin:    General: Skin is warm and dry.   Neurological:     General: No focal deficit present.     Mental Status: He is alert and oriented to person, place, and time. Mental status is at baseline.  Psychiatric:        Mood and Affect: Affect normal. Mood is anxious.  Behavior: Behavior normal.         Assessment & Plan:   1. Annual physical exam  - CMP14+EGFR  2. GAD (generalized anxiety disorder)   3. Screening for deficiency anemia  - CBC with Differential  4. Screening for lipid disorders  - Lipid Panel    Return in about 3 months (around 08/17/2022) for follow up.   Becky Sax, MD

## 2022-05-25 ENCOUNTER — Ambulatory Visit: Payer: BC Managed Care – PPO | Admitting: Internal Medicine

## 2022-06-07 ENCOUNTER — Ambulatory Visit (INDEPENDENT_AMBULATORY_CARE_PROVIDER_SITE_OTHER): Payer: BC Managed Care – PPO | Admitting: Internal Medicine

## 2022-06-07 ENCOUNTER — Encounter: Payer: Self-pay | Admitting: Internal Medicine

## 2022-06-07 VITALS — BP 130/84 | HR 82 | Temp 98.2°F | Ht 69.0 in | Wt 254.8 lb

## 2022-06-07 DIAGNOSIS — G4733 Obstructive sleep apnea (adult) (pediatric): Secondary | ICD-10-CM | POA: Diagnosis not present

## 2022-06-07 DIAGNOSIS — J4520 Mild intermittent asthma, uncomplicated: Secondary | ICD-10-CM

## 2022-06-07 NOTE — Patient Instructions (Signed)
Please schedule follow up scheduled with myself in 3 months.  If my schedule is not open yet, we will contact you with a reminder closer to that time. Please call [phone] if you haven't heard from [place] a month before.   Please make sure your CPAP machine is being used 30-90 days before you come to see me. I need to be able to review your download.  Start taking advair inhaler as needed - when you are getting sick or feel a flare coming start taking twice a day and continue for 4-5 days until resolutino of symptoms.

## 2022-06-07 NOTE — Progress Notes (Signed)
Derek Fitzgerald    [id number]    [birth year]  Primary Care Physician:Wilson, Lauris Poag, MD Date of Appointment: 06/07/2022 Established Patient Visit  Chief complaint:   Chief Complaint  Patient presents with   Follow-up    Cpap Titration f/u      HPI: MANOLIS SOSINSKI is a 42 y.o. man with moderate persistent asthma and endobronchial carcinoid tumor s/p LUL lobectomy with Dr. Dorris Fetch in April 2023  Interval Updates: Here for follow up for asthma and OSA. Here for download review for OSA and asthma follow up.   Asthma is doing well. He lost his advair inhaler and hasn't been taking it. Not taking albuterol either.   Montelukast seems to be helping his allergies.   Current Regimen: albuterol prn, advair 1 puff twice a day.  Asthma Triggers: exertion, talking  Exacerbations in the last year: none since Jan 2022 History of hospitalization or intubation: none Allergy Testing: none GERD: denies Allergic Rhinitis: yes on montelukast.  ACT:  Asthma Control Test ACT Total Score  06/07/2022  3:30 PM 24  12/07/2021  4:06 PM 18  08/04/2021  3:27 PM 19   I have reviewed the patient's family social and past medical history and updated as appropriate.   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   Depression    Fatty liver    History of kidney stones    Hypercholesteremia     Past Surgical History:  Procedure Laterality Date   BIOPSY  04/13/2021   Procedure: BIOPSY;  Surgeon: Charlott Holler, MD;  Location: [facility];  Service: Pulmonary;;   BRONCHIAL BRUSHINGS  04/13/2021   Procedure: BRONCHIAL BRUSHINGS;  Surgeon: Charlott Holler, MD;  Location: [facility];  Service: Pulmonary;;   BRONCHIAL WASHINGS  04/13/2021   Procedure: BRONCHIAL WASHINGS;  Surgeon: Charlott Holler, MD;  Location: [facility];  Service: Pulmonary;;   INTERCOSTAL  NERVE BLOCK Left 06/13/2021   Procedure: INTERCOSTAL NERVE BLOCK;  Surgeon: Loreli Slot, MD;  [facility];  Service: Thoracic;  Laterality: Left;   LASER BRONCHOSCOPY N/A 06/13/2021   Procedure: LASER BRONCHOSCOPY;  Surgeon: Loreli Slot, MD;  Location: [facility] OR;  Service: Thoracic;  Laterality: N/A;   NODE DISSECTION Left 06/13/2021   Procedure: NODE DISSECTION;  Surgeon: Loreli Slot, MD;  Location: [facility] OR;  Service: Thoracic;  Laterality: Left;   VIDEO BRONCHOSCOPY N/A 04/13/2021   Procedure: VIDEO BRONCHOSCOPY WITHOUT FLUORO;  Surgeon: Charlott Holler, MD;  Location: [facility];  Service: Pulmonary;  Laterality: N/A;   VIDEO BRONCHOSCOPY N/A 06/13/2021   Procedure: VIDEO BRONCHOSCOPY;  Surgeon: Loreli Slot, MD;  Location: [facility] OR;  Service: Thoracic;  Laterality: N/A;   WISDOM TOOTH EXTRACTION      Family History  Problem Relation Age of Onset   Prostate cancer Father    Asthma Son     Social History   Occupational History   Not on file  Tobacco Use   Smoking status: Never   Smokeless tobacco: Never  Vaping Use   Vaping Use: Never used  Substance and Sexual Activity   Alcohol use: Yes    Comment: socially   Drug use: Never   Sexual activity: Not Currently     Physical Exam: Blood pressure 130/84, pulse 82,  temperature 98.2 F (36.8 C), height 5\' 9"  (1.753 m), weight 254 lb 12.8 oz (115.6 kg), SpO2 97 %.  Gen:      No acute distress ENT:  mallampati IV, enlarged neck circumference Lungs:    diminished, clear CV:         RRR no edema   Data Reviewed: Imaging: I have personally reviewed the chest xray June 2023 = no acute process  PFTs:     Latest Ref Rng & Units 04/21/2021    1:06 PM  PFT Results  FVC-Pre L 3.92   FVC-Predicted Pre % 92   FVC-Post L 4.03   FVC-Predicted Post % 94   Pre FEV1/FVC % % 72   Post FEV1/FCV % % 75   FEV1-Pre L 2.83   FEV1-Predicted Pre % 81   FEV1-Post L 3.03    I have personally reviewed  the patient's PFTs and normal pulmonary function.   Labs: Lab Results  Component Value Date   WBC 7.1 05/17/2022   HGB 16.0 05/17/2022   HCT 47.1 05/17/2022   MCV 88 05/17/2022   PLT CANCELED 05/17/2022   Lab Results  Component Value Date   NA 140 05/17/2022   K 4.5 05/17/2022   CL 103 05/17/2022   CO2 21 05/17/2022    Immunization status:  There is no immunization history on file for this patient.  External Records Personally Reviewed:   Assessment:  Moderate persistent asthma not well controlled Allergic rhinitis LUL endobronchial carcinoid s/p lobectomy in remission OSA on CPAP - needs to resume cpap therapy.   Plan/Recommendations:  Continue prn albuterol Continue montelukast Start taking advair inhaler as needed - when you are getting sick or feel a flare coming start taking twice a day and continue for 4-5 days until resolutino of symptoms.   Return to Care: Return in about 3 months (around 09/06/2022).   Durel Salts, MD Pulmonary and Critical Care Medicine [facility] [phone]

## 2022-07-12 ENCOUNTER — Other Ambulatory Visit: Payer: Self-pay | Admitting: Family Medicine

## 2022-07-12 DIAGNOSIS — F411 Generalized anxiety disorder: Secondary | ICD-10-CM

## 2022-08-18 ENCOUNTER — Telehealth: Payer: Self-pay | Admitting: Internal Medicine

## 2022-08-18 NOTE — Telephone Encounter (Signed)
Called patient regarding Rescheduled July appointment, tried to leave a voicemail several times. Calendar will be mailed.

## 2022-08-21 ENCOUNTER — Other Ambulatory Visit: Payer: Self-pay | Admitting: Family Medicine

## 2022-08-28 ENCOUNTER — Inpatient Hospital Stay: Payer: BC Managed Care – PPO | Attending: Internal Medicine

## 2022-08-28 ENCOUNTER — Other Ambulatory Visit: Payer: Self-pay

## 2022-08-28 ENCOUNTER — Ambulatory Visit (HOSPITAL_COMMUNITY)
Admission: RE | Admit: 2022-08-28 | Discharge: 2022-08-28 | Disposition: A | Discharge status: Home / Self Care | Payer: BC Managed Care – PPO | Source: Ambulatory Visit | Attending: Internal Medicine | Admitting: Internal Medicine

## 2022-08-28 DIAGNOSIS — Z8042 Family history of malignant neoplasm of prostate: Secondary | ICD-10-CM | POA: Insufficient documentation

## 2022-08-28 DIAGNOSIS — C7A090 Malignant carcinoid tumor of the bronchus and lung: Secondary | ICD-10-CM | POA: Diagnosis present

## 2022-08-28 DIAGNOSIS — Z902 Acquired absence of lung [part of]: Secondary | ICD-10-CM | POA: Diagnosis not present

## 2022-08-28 LAB — CBC WITH DIFFERENTIAL (CANCER CENTER ONLY)
Abs Immature Granulocytes: 0.01 10*3/uL (ref 0.00–0.07)
Basophils Absolute: 0 10*3/uL (ref 0.0–0.1)
Basophils Relative: 1 %
Eosinophils Absolute: 0.2 10*3/uL (ref 0.0–0.5)
Eosinophils Relative: 4 %
HCT: 41.2 % (ref 39.0–52.0)
Hemoglobin: 14.4 g/dL (ref 13.0–17.0)
Immature Granulocytes: 0 %
Lymphocytes Relative: 52 %
Lymphs Abs: 3.2 10*3/uL (ref 0.7–4.0)
MCH: 31.2 pg (ref 26.0–34.0)
MCHC: 35 g/dL (ref 30.0–36.0)
MCV: 89.4 fL (ref 80.0–100.0)
Monocytes Absolute: 0.6 10*3/uL (ref 0.1–1.0)
Monocytes Relative: 10 %
Neutro Abs: 2 10*3/uL (ref 1.7–7.7)
Neutrophils Relative %: 33 %
Platelet Count: 99 10*3/uL — ABNORMAL LOW (ref 150–400)
RBC: 4.61 MIL/uL (ref 4.22–5.81)
RDW: 12.6 % (ref 11.5–15.5)
WBC Count: 6 10*3/uL (ref 4.0–10.5)
nRBC: 0 % (ref 0.0–0.2)

## 2022-08-28 LAB — CMP (CANCER CENTER ONLY)
ALT: 92 U/L — ABNORMAL HIGH (ref 0–44)
AST: 37 U/L (ref 15–41)
Albumin: 3.8 g/dL (ref 3.5–5.0)
Alkaline Phosphatase: 74 U/L (ref 38–126)
Anion gap: 6 (ref 5–15)
BUN: 12 mg/dL (ref 6–20)
CO2: 25 mmol/L (ref 22–32)
Calcium: 8.3 mg/dL — ABNORMAL LOW (ref 8.9–10.3)
Chloride: 107 mmol/L (ref 98–111)
Creatinine: 1.11 mg/dL (ref 0.61–1.24)
GFR, Estimated: >60 mL/min (ref >60)
Glucose, Bld: 81 mg/dL (ref 70–99)
Potassium: 3.8 mmol/L (ref 3.5–5.1)
Sodium: 138 mmol/L (ref 135–145)
Total Bilirubin: 1 mg/dL (ref 0.3–1.2)
Total Protein: 6.5 g/dL (ref 6.5–8.1)

## 2022-08-28 MED ORDER — IOHEXOL 300 MG/ML  SOLN
75.0000 mL | Freq: Once | INTRAMUSCULAR | Status: AC | PRN
  Administered 2022-08-28: 75 mL via INTRAVENOUS

## 2022-08-30 ENCOUNTER — Ambulatory Visit: Payer: BC Managed Care – PPO | Admitting: Internal Medicine

## 2022-09-07 ENCOUNTER — Other Ambulatory Visit: Payer: Self-pay

## 2022-09-07 ENCOUNTER — Inpatient Hospital Stay (HOSPITAL_BASED_OUTPATIENT_CLINIC_OR_DEPARTMENT_OTHER): Payer: BC Managed Care – PPO | Admitting: Internal Medicine

## 2022-09-07 VITALS — BP 137/96 | HR 83 | Temp 98.0°F | Resp 18 | Ht 69.0 in | Wt 242.7 lb

## 2022-09-07 DIAGNOSIS — C7A090 Malignant carcinoid tumor of the bronchus and lung: Secondary | ICD-10-CM

## 2022-09-07 NOTE — Progress Notes (Signed)
[facility] [phone]   [phone]  OFFICE PROGRESS NOTE  Derek Skeans, MD 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 Dorris Fetch.   CURRENT THERAPY: Observation  INTERVAL HISTORY: Derek Fitzgerald 42 y.o. male returns to the clinic today for annual follow-up visit.  The patient is feeling fine today with no concerning complaints except for the intermittent pain on the left side of the chest and numbness from the surgical scar.  He denied having any shortness of breath, cough or hemoptysis.  He has no nausea, vomiting, diarrhea or constipation.  He has no headache or visual changes.  He has no recent weight loss or night sweats.  He is here today for evaluation with repeat CT scan of the chest for restaging of his disease.  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  MEDICATIONS:  Current Outpatient Medications  Medication Sig Dispense Refill   acetaminophen (TYLENOL) 325 MG tablet Take 650 mg by mouth every 6 (six) hours as needed for moderate pain.     albuterol (VENTOLIN HFA) 108 (90 Base) MCG/ACT inhaler Inhale 1-2 puffs into the lungs every 6 (six) hours as needed for wheezing or shortness of breath. 1 each 5   atorvastatin (LIPITOR) 20 MG tablet TAKE 1 TABLET BY MOUTH EVERY DAY 90 tablet 1   buPROPion (WELLBUTRIN XL) 150 MG 24 hr tablet TAKE 1 TABLET BY MOUTH EVERY DAY 90 tablet 0   CLARITIN 10 MG tablet Take 10 mg by mouth daily as needed  for allergies or rhinitis.     escitalopram (LEXAPRO) 10 MG tablet Take 1 tablet (10 mg total) by mouth daily. 30 tablet 1   FLONASE ALLERGY RELIEF 50 MCG/ACT nasal spray Place 1-2 sprays into both nostrils 2 (two) times daily as needed for allergies or rhinitis.     fluticasone-salmeterol (ADVAIR HFA) 230-21 MCG/ACT inhaler Inhale 2 puffs into the lungs 2 (two) times daily. 1 each 3   losartan (COZAAR) 100 MG tablet Take 1 tablet (100 mg total) by mouth daily. 90 tablet 1   montelukast (SINGULAIR) 10 MG tablet Take 1 tablet (10 mg total) by mouth at bedtime. 30 tablet 11   rosuvastatin (CRESTOR) 20 MG tablet Take by mouth.     No current facility-administered medications for this visit.    SURGICAL HISTORY:  Past Surgical History:  Procedure Laterality Date   BIOPSY  04/13/2021   Procedure: BIOPSY;  Surgeon: Charlott Holler, MD;  Location: [facility];  Service: Pulmonary;;   BRONCHIAL BRUSHINGS  04/13/2021   Procedure: BRONCHIAL BRUSHINGS;  Surgeon: Charlott Holler, MD;  Location: [facility];  Service: Pulmonary;;   BRONCHIAL WASHINGS  04/13/2021   Procedure: BRONCHIAL WASHINGS;  Surgeon: Charlott Holler, MD;  Location: [facility];  Service: Pulmonary;;   INTERCOSTAL NERVE BLOCK Left 06/13/2021   Procedure: INTERCOSTAL NERVE BLOCK;  Surgeon: Loreli Slot, MD;  Location: [facility] OR;  Service: Thoracic;  Laterality: Left;   LASER BRONCHOSCOPY N/A 06/13/2021   Procedure: LASER BRONCHOSCOPY;  Surgeon: Loreli Slot, MD;  Location: [facility] OR;  Service: Thoracic;  Laterality: N/A;   NODE DISSECTION Left 06/13/2021   Procedure: NODE DISSECTION;  Surgeon: Loreli Slot, MD;  Location: [facility] OR;  Service: Thoracic;  Laterality: Left;   VIDEO BRONCHOSCOPY N/A 04/13/2021   Procedure: VIDEO BRONCHOSCOPY WITHOUT FLUORO;  Surgeon: Charlott Holler, MD;  Location: [facility];  Service: Pulmonary;  Laterality: N/A;   VIDEO BRONCHOSCOPY N/A 06/13/2021   Procedure: VIDEO BRONCHOSCOPY;   Surgeon: Loreli Slot, MD;  Location: [facility] OR;  Service: Thoracic;  Laterality: N/A;   WISDOM TOOTH EXTRACTION      REVIEW OF SYSTEMS:  A comprehensive review of systems was negative except for: Respiratory: positive for pleurisy/chest pain   PHYSICAL EXAMINATION: General appearance: alert, cooperative, and no distress Head: Normocephalic, without obvious abnormality, atraumatic Neck: no adenopathy, no JVD, supple, symmetrical, trachea midline, and thyroid not enlarged, symmetric, no tenderness/mass/nodules Lymph nodes: Cervical, supraclavicular, and axillary nodes normal. Resp: clear to auscultation bilaterally Back: symmetric, no curvature. ROM normal. No CVA tenderness. Cardio: regular rate and rhythm, S1, S2 normal, no murmur, click, rub or gallop GI: soft, non-tender; bowel sounds normal; no masses,  no organomegaly Extremities: extremities normal, atraumatic, no cyanosis or edema  ECOG PERFORMANCE STATUS: 0 - Asymptomatic  Blood pressure (!) 137/96, pulse 83, temperature 98 F (36.7 C), temperature source Oral, resp. rate 18, height 5\' 9"  (1.753 m), weight 242 lb 11.2 oz (110.1 kg), SpO2 99%.  LABORATORY DATA: Lab Results  Component Value Date   WBC 6.0 08/28/2022   HGB 14.4 08/28/2022   HCT 41.2 08/28/2022   MCV 89.4 08/28/2022   PLT 99 (L) 08/28/2022      Chemistry      Component Value Date/Time   NA 138 08/28/2022 1112   NA 140 05/17/2022 1448   K 3.8 08/28/2022 1112   CL 107 08/28/2022 1112   CO2 25 08/28/2022 1112   BUN 12 08/28/2022 1112   BUN 11 05/17/2022 1448   CREATININE 1.11 08/28/2022 1112      Component Value Date/Time   CALCIUM 8.3 (L) 08/28/2022 1112   ALKPHOS 74 08/28/2022 1112   AST 37 08/28/2022 1112   ALT 92 (H) 08/28/2022 1112   BILITOT 1.0 08/28/2022 1112       RADIOGRAPHIC STUDIES: CT Chest W Contrast  Result Date: 08/30/2022 CLINICAL DATA:  Lung cancer, neuroendocrine tumor. * Tracking Code: BO * EXAM: CT CHEST WITH CONTRAST  TECHNIQUE: Multidetector CT imaging of the chest was performed during intravenous contrast administration. RADIATION DOSE REDUCTION: This exam was performed according to the departmental dose-optimization program which includes automated exposure control, adjustment of the mA and/or kV according to patient size and/or use of iterative reconstruction technique. CONTRAST:  75mL OMNIPAQUE IOHEXOL 300 MG/ML  SOLN COMPARISON:  04/03/2021. FINDINGS: Cardiovascular: Heart size normal. Left ventricle is hypertrophied. No pericardial effusion. Mediastinum/Nodes: Mediastinal and hilar lymph nodes are not enlarged by CT size criteria. Probable thymic tissue in the prevascular space, as before. No axillary adenopathy. Esophagus is grossly unremarkable. Lungs/Pleura: Left upper lobectomy. Lungs are otherwise clear. No pleural fluid. Airway is unremarkable. Upper Abdomen: Subcentimeter low-attenuation lesion in the dome of the liver, too small to characterize. Visualized portions of the liver, gallbladder adrenal glands are otherwise unremarkable. Subcentimeter low-attenuation lesion in the right kidney, too small to characterize. No specific follow-up  necessary. Visualized portions of the kidneys, spleen, pancreas, stomach and bowel are otherwise grossly unremarkable. No upper abdominal adenopathy. Musculoskeletal: No worrisome lytic or sclerotic lesions. IMPRESSION: No evidence of recurrent or metastatic disease. Electronically Signed   By: Leanna Battles M.D.   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 Dorris Fetch.  The patient is currently on observation and he is feeling fine with no concerning complaints except for the mild left-sided chest pain and  numbness from the surgical scar. He had repeat CT scan of the chest performed recently.  I personally and independently reviewed the scan and discussed the result with the patient today. His scan showed no concerning findings for disease recurrence or metastasis. I recommended for the patient to continue on observation with repeat CT scan of the chest in 1 year. The patient was advised to call immediately if he has any concerning symptoms in the interval. The patient voices understanding of current disease status and treatment options and is in agreement with the current care plan.  All questions were answered. The patient knows to call the clinic with any problems, questions or concerns. We can certainly see the patient much sooner if necessary.  The total time spent in the appointment was 20 minutes.  Disclaimer: This note was dictated with voice recognition software. Similar sounding words can inadvertently be transcribed and may not be corrected upon review.

## 2022-10-06 ENCOUNTER — Other Ambulatory Visit: Payer: Self-pay | Admitting: Family Medicine

## 2022-10-06 NOTE — Telephone Encounter (Signed)
Requested Prescriptions  Pending Prescriptions Disp Refills   losartan (COZAAR) 100 MG tablet [Pharmacy Med Name: LOSARTAN POTASSIUM 100 MG TAB] 90 tablet 1    Sig: TAKE 1 TABLET BY MOUTH EVERY DAY     Cardiovascular:  Angiotensin Receptor Blockers Failed - 10/06/2022  2:29 AM      Failed - Last BP in normal range    BP Readings from Last 1 Encounters:  09/07/22 (!) 137/96         Passed - Cr in normal range and within 180 days    Creatinine  Date Value Ref Range Status  08/28/2022 1.11 0.61 - 1.24 mg/dL Final         Passed - K in normal range and within 180 days    Potassium  Date Value Ref Range Status  08/28/2022 3.8 3.5 - 5.1 mmol/L Final         Passed - Patient is not pregnant      Passed - Valid encounter within last 6 months    Recent Outpatient Visits           4 months ago Annual physical exam   [facility], MD   8 months ago Essential hypertension   [facility], MD   10 months ago Essential hypertension   [facility], MD   1 year ago Essential hypertension   [facility], MD   1 year ago Essential hypertension   [facility], [place], [place]

## 2022-10-11 ENCOUNTER — Other Ambulatory Visit: Payer: Self-pay | Admitting: Family Medicine

## 2022-10-11 DIAGNOSIS — F411 Generalized anxiety disorder: Secondary | ICD-10-CM

## 2022-11-09 ENCOUNTER — Ambulatory Visit
Admission: EM | Admit: 2022-11-09 | Discharge: 2022-11-09 | Disposition: A | Discharge status: Home / Self Care | Payer: BC Managed Care – PPO | Attending: Internal Medicine | Admitting: Internal Medicine

## 2022-11-09 DIAGNOSIS — J029 Acute pharyngitis, unspecified: Secondary | ICD-10-CM | POA: Diagnosis present

## 2022-11-09 DIAGNOSIS — J45909 Unspecified asthma, uncomplicated: Secondary | ICD-10-CM | POA: Diagnosis not present

## 2022-11-09 DIAGNOSIS — B9789 Other viral agents as the cause of diseases classified elsewhere: Secondary | ICD-10-CM | POA: Insufficient documentation

## 2022-11-09 DIAGNOSIS — Z1152 Encounter for screening for COVID-19: Secondary | ICD-10-CM | POA: Insufficient documentation

## 2022-11-09 DIAGNOSIS — Z85118 Personal history of other malignant neoplasm of bronchus and lung: Secondary | ICD-10-CM | POA: Diagnosis not present

## 2022-11-09 DIAGNOSIS — Z9889 Other specified postprocedural states: Secondary | ICD-10-CM | POA: Insufficient documentation

## 2022-11-09 DIAGNOSIS — J069 Acute upper respiratory infection, unspecified: Secondary | ICD-10-CM

## 2022-11-09 LAB — POCT RAPID STREP A (OFFICE): Rapid Strep A Screen: NEGATIVE

## 2022-11-09 MED ORDER — BENZONATATE 100 MG PO CAPS: 100.0000 mg | ORAL_CAPSULE | Freq: Three times a day (TID) | ORAL | 0 refills | Status: AC | PRN

## 2022-11-09 NOTE — Discharge Instructions (Signed)
strep was negative.  Throat culture and COVID is pending.  It appears that you have a viral illness.  I have prescribed a cough medication to take as needed.  Follow-up if any symptoms persist or worsen.

## 2022-11-09 NOTE — ED Triage Notes (Signed)
Pt states that he has a cough, chest congestion, and sore throat. X3 days

## 2022-11-09 NOTE — ED Provider Notes (Signed)
[name]    [name]: [id number] Arrival date & time: 11/09/22  1451      History   Chief Complaint Chief Complaint  Patient presents with   Cough    Cough, chest congestion, and sore throat x3 days    HPI Derek Fitzgerald is a 42 y.o. male.   Patient presents with 3-day history of cough, feelings of chest congestion, sore throat, nasal congestion.  Reports that he has had a little bit of productive cough with blood-tinged sputum.  He reports he has a history of lung cancer approximately 1 year ago where he is now in remission but he had left upper lobe resection.  Denies any fever.  His daughter has had similar symptoms.  Denies chest pain or shortness of breath.  He also has a history of asthma but has not had to use albuterol inhaler since being sick.   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   Mixed hyperlipidemia 06/21/2021   GAD (generalized anxiety disorder) 06/21/2021   Elevated liver enzymes 06/21/2021   Hypokalemia 06/21/2021   Prediabetes 06/21/2021   Class 2 severe obesity due to excess calories with serious comorbidity and body mass index (BMI) of 35.0 to 35.9 in adult [facility]) 06/21/2021   Fatty liver 06/21/2021   Essential hypertension 06/16/2021   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   Lung nodule 05/24/2021   History of COVID-19 03/11/2021   History of asthma 03/11/2021   Wheezing 03/11/2021   Elevated LDL cholesterol level 09/23/2015   Delayed ejaculation 09/21/2015   Healthcare maintenance 09/21/2015   Acute pain of both shoulders 08/30/2015   Lumbar strain 08/30/2015   Chronic diffuse otitis externa of  both ears 05/20/2015   Acute eczematoid otitis externa of right ear 04/22/2015   Seasonal allergic rhinitis due to pollen 04/22/2015   Sleep disturbance 02/11/2014   Hematochezia 08/14/2013   Sciatica 08/14/2013   Reactive airway disease 07/25/2013   Pharyngitis 07/25/2013   Allergic rhinitis 06/03/2013   Acute maxillary sinusitis 01/17/2013   Family circumstance 01/17/2013   Acute laryngopharyngitis 08/31/2012   Elevated blood pressure reading 08/31/2012   Malaise and fatigue 08/31/2012    Past Surgical History:  Procedure Laterality Date   BIOPSY  04/13/2021   Procedure: BIOPSY;  Surgeon: Charlott Holler, MD;  Location: [facility];  Service: Pulmonary;;   BRONCHIAL BRUSHINGS  04/13/2021   Procedure: BRONCHIAL BRUSHINGS;  Surgeon: Charlott Holler, MD;  Location: [facility];  Service: Pulmonary;;   BRONCHIAL WASHINGS  04/13/2021   Procedure: BRONCHIAL WASHINGS;  Surgeon: Charlott Holler, MD;  Location: [facility];  Service: Pulmonary;;   INTERCOSTAL NERVE BLOCK Left 06/13/2021   Procedure: INTERCOSTAL NERVE BLOCK;  Surgeon: Loreli Slot, MD;  Location: [facility] OR;  Service: Thoracic;  Laterality: Left;   LASER BRONCHOSCOPY N/A 06/13/2021   Procedure: LASER BRONCHOSCOPY;  Surgeon: Loreli Slot, MD;  Location: [facility] OR;  Service: Thoracic;  Laterality: N/A;   NODE DISSECTION Left 06/13/2021   Procedure: NODE DISSECTION;  Surgeon: Loreli Slot, MD;  Location: [facility] OR;  Service: Thoracic;  Laterality: Left;   VIDEO BRONCHOSCOPY N/A 04/13/2021   Procedure: VIDEO BRONCHOSCOPY WITHOUT FLUORO;  Surgeon: Charlott Holler, MD;  Location: [facility];  Service: Pulmonary;  Laterality: N/A;   VIDEO BRONCHOSCOPY N/A 06/13/2021   Procedure: VIDEO BRONCHOSCOPY;  Surgeon: Loreli Slot, MD;  Location: [facility] OR;  Service: Thoracic;  Laterality: N/A;   WISDOM TOOTH EXTRACTION         Home Medications    Prior to Admission medications   Medication Sig Start Date End  Date Taking? Authorizing Provider  acetaminophen (TYLENOL) 325 MG tablet Take 650 mg by mouth every 6 (six) hours as needed for moderate pain.   Yes [provider]  albuterol (VENTOLIN HFA) 108 (90 Base) MCG/ACT inhaler Inhale 1-2 puffs into the lungs every 6 (six) hours as needed for wheezing or shortness of breath. 03/31/21  Yes Charlott Holler, MD  atorvastatin (LIPITOR) 20 MG tablet TAKE 1 TABLET BY MOUTH EVERY DAY 08/21/22  Yes [name], MD  benzonatate (TESSALON) 100 MG capsule Take 1 capsule (100 mg total) by mouth every 8 (eight) hours as needed for cough. 11/09/22  Yes Gabi Mcfate, Rolly Salter E, FNP  buPROPion (WELLBUTRIN XL) 150 MG 24 hr tablet TAKE 1 TABLET BY MOUTH EVERY DAY 10/11/22  Yes [name], MD  CLARITIN 10 MG tablet Take 10 mg by mouth daily as needed for allergies or rhinitis.   Yes [provider]  escitalopram (LEXAPRO) 10 MG tablet Take 1 tablet (10 mg total) by mouth daily. 05/17/22  Yes [name], MD  [facility] [address] ALLERGY RELIEF 50 MCG/ACT nasal spray Place 1-2 sprays into both nostrils 2 (two) times daily as needed for allergies or rhinitis.   Yes [provider]  fluticasone-salmeterol (ADVAIR HFA) 230-21 MCG/ACT inhaler Inhale 2 puffs into the lungs 2 (two) times daily. 09/30/21  Yes Charlott Holler, MD  losartan (COZAAR) 100 MG tablet TAKE 1 TABLET BY MOUTH EVERY DAY 10/06/22  Yes [name], MD  montelukast (SINGULAIR) 10 MG tablet Take 1 tablet (10 mg total) by mouth at bedtime. 12/07/21  Yes Charlott Holler, MD  rosuvastatin (CRESTOR) 20 MG tablet Take by mouth. 09/23/15  Yes [provider]    Family History Family History  Problem Relation Age of Onset   Prostate cancer Father    Asthma Son     Social History Social History   Tobacco Use   Smoking status: Never   Smokeless tobacco: Never  Vaping Use   Vaping status: Never Used  Substance Use Topics   Alcohol use: Yes    Comment: socially   Drug use: Never      Allergies   Other   Review of Systems Review of Systems Per HPI  Physical Exam Triage Vital Signs ED Triage Vitals  Encounter Vitals Group     BP 11/09/22 1554 138/89     Systolic BP Percentile --      Diastolic BP Percentile --      Pulse Rate 11/09/22 1554 85     Resp 11/09/22 1554 17     Temp 11/09/22 1554 98.8 F (37.1 C)     Temp Source 11/09/22 1554 Oral     SpO2 11/09/22 1554 98 %     Weight 11/09/22 1553 240 lb (108.9 kg)     Height 11/09/22 1553 5\' 9"  (1.753 m)     Head Circumference --      Peak Flow --  Pain Score 11/09/22 1553 3     Pain Loc --      Pain Education --      Exclude from Growth Chart --    No data found.  Updated Vital Signs BP 138/89 (BP Location: Left Arm)   Pulse 85   Temp 98.8 F (37.1 C) (Oral)   Resp 17   Ht 5\' 9"  (1.753 m)   Wt 240 lb (108.9 kg)   SpO2 98%   BMI 35.44 kg/m   Visual Acuity Right Eye Distance:   Left Eye Distance:   Bilateral Distance:    Right Eye Near:   Left Eye Near:    Bilateral Near:     Physical Exam Constitutional:      General: He is not in acute distress.    Appearance: Normal appearance. He is not toxic-appearing or diaphoretic.  HENT:     Head: Normocephalic and atraumatic.     Right Ear: Tympanic membrane and ear canal normal.     Left Ear: Tympanic membrane and ear canal normal.     Nose: Congestion present.     Mouth/Throat:     Mouth: Mucous membranes are moist.     Pharynx: Posterior oropharyngeal erythema present.  Eyes:     Extraocular Movements: Extraocular movements intact.     Conjunctiva/sclera: Conjunctivae normal.     Pupils: Pupils are equal, round, and reactive to light.  Cardiovascular:     Rate and Rhythm: Normal rate and regular rhythm.     Pulses: Normal pulses.     Heart sounds: Normal heart sounds.  Pulmonary:     Effort: Pulmonary effort is normal. No respiratory distress.     Breath sounds: Normal breath sounds. No stridor. No wheezing, rhonchi or  rales.  Abdominal:     General: Abdomen is flat. Bowel sounds are normal.     Palpations: Abdomen is soft.  Musculoskeletal:        General: Normal range of motion.     Cervical back: Normal range of motion.  Skin:    General: Skin is warm and dry.  Neurological:     General: No focal deficit present.     Mental Status: He is alert and oriented to person, place, and time. Mental status is at baseline.  Psychiatric:        Mood and Affect: Mood normal.        Behavior: Behavior normal.      UC Treatments / Results  Labs (all labs ordered are listed, but only abnormal results are displayed) Labs Reviewed  CULTURE, GROUP A STREP ([facility])  SARS CORONAVIRUS 2 (TAT 6-24 HRS)  POCT RAPID STREP A (OFFICE)    EKG   Radiology No results found.  Procedures Procedures (including critical care time)  Medications Ordered in UC Medications - No data to display  Initial Impression / Assessment and Plan / UC Course  I have reviewed the triage vital signs and the nursing notes.  Pertinent labs & imaging results that were available during my care of the patient were reviewed by me and considered in my medical decision making (see chart for details).     Patient presents with symptoms likely from a viral upper respiratory infection.  Suggested chest x-ray given patient reporting of blood-tinged sputum given patient's history of lung cancer but he declined this.  Patient is nontoxic appearing and not in need of emergent medical intervention. strep was negative.  Throat culture and COVID test pending.  Recommended  symptom control with medications and supportive care.  Cough medication prescribed for patient.  Return if symptoms fail to improve in 1-2 weeks or you develop shortness of breath, chest pain, severe headache. Patient states understanding and is agreeable.  Discharged with PCP followup.  Final Clinical Impressions(s) / UC Diagnoses   Final diagnoses:  Viral upper  respiratory tract infection with cough  Sore throat     Discharge Instructions      strep was negative.  Throat culture and COVID is pending.  It appears that you have a viral illness.  I have prescribed a cough medication to take as needed.  Follow-up if any symptoms persist or worsen.     ED Prescriptions     Medication Sig Dispense Auth. Provider   benzonatate (TESSALON) 100 MG capsule Take 1 capsule (100 mg total) by mouth every 8 (eight) hours as needed for cough. 21 capsule [place], Acie Fred[place], [place]      PDMP not reviewed this encounter.   Gustavus Bryant, [place] 11/09/22 [phone]

## 2022-11-10 LAB — SARS CORONAVIRUS 2 (TAT 6-24 HRS): SARS Coronavirus 2: NEGATIVE

## 2022-11-12 LAB — CULTURE, GROUP A STREP (THRC)

## 2022-12-09 ENCOUNTER — Other Ambulatory Visit: Payer: Self-pay | Admitting: Family Medicine

## 2022-12-09 DIAGNOSIS — F411 Generalized anxiety disorder: Secondary | ICD-10-CM

## 2022-12-14 ENCOUNTER — Other Ambulatory Visit: Payer: Self-pay | Admitting: Family Medicine

## 2023-01-09 ENCOUNTER — Ambulatory Visit: Payer: BC Managed Care – PPO | Admitting: Family Medicine

## 2023-01-09 ENCOUNTER — Encounter: Payer: Self-pay | Admitting: Family Medicine

## 2023-01-09 VITALS — BP 121/86 | HR 86 | Temp 98.4°F | Resp 16 | Ht 70.0 in | Wt 242.0 lb

## 2023-01-09 DIAGNOSIS — E6609 Other obesity due to excess calories: Secondary | ICD-10-CM

## 2023-01-09 DIAGNOSIS — E66811 Obesity, class 1: Secondary | ICD-10-CM

## 2023-01-09 DIAGNOSIS — Z23 Encounter for immunization: Secondary | ICD-10-CM

## 2023-01-09 DIAGNOSIS — F411 Generalized anxiety disorder: Secondary | ICD-10-CM | POA: Diagnosis not present

## 2023-01-09 DIAGNOSIS — Z13220 Encounter for screening for lipoid disorders: Secondary | ICD-10-CM

## 2023-01-09 DIAGNOSIS — I10 Essential (primary) hypertension: Secondary | ICD-10-CM

## 2023-01-09 DIAGNOSIS — Z6834 Body mass index (BMI) 34.0-34.9, adult: Secondary | ICD-10-CM

## 2023-01-09 MED ORDER — BUPROPION HCL ER (XL) 150 MG PO TB24: 150.0000 mg | ORAL_TABLET | Freq: Every day | ORAL | 0 refills | Status: DC

## 2023-01-09 MED ORDER — ATORVASTATIN CALCIUM 20 MG PO TABS: 20.0000 mg | ORAL_TABLET | Freq: Every day | ORAL | 1 refills | Status: DC

## 2023-01-09 MED ORDER — ESCITALOPRAM OXALATE 10 MG PO TABS: 10.0000 mg | ORAL_TABLET | Freq: Every day | ORAL | 1 refills | Status: DC

## 2023-01-15 ENCOUNTER — Encounter: Payer: Self-pay | Admitting: Family Medicine

## 2023-01-15 NOTE — Progress Notes (Signed)
Established Patient Office Visit  Subjective    Patient ID: Derek Fitzgerald, male    DOB: 1981-01-26  Age: 42 y.o. MRN: [id number]  CC:  Chief Complaint  Patient presents with   Follow-up    Refill on b/p medication    HPI Derek Fitzgerald presents for routine follow up of hypertension. Patient reports taking meds as recommended and denies acute complaints or concerns.   Outpatient Encounter Medications as of 01/09/2023  Medication Sig   acetaminophen (TYLENOL) 325 MG tablet Take 650 mg by mouth every 6 (six) hours as needed for moderate pain.   albuterol (VENTOLIN HFA) 108 (90 Base) MCG/ACT inhaler Inhale 1-2 puffs into the lungs every 6 (six) hours as needed for wheezing or shortness of breath.   benzonatate (TESSALON) 100 MG capsule Take 1 capsule (100 mg total) by mouth every 8 (eight) hours as needed for cough.   CLARITIN 10 MG tablet Take 10 mg by mouth daily as needed for allergies or rhinitis.   FLONASE ALLERGY RELIEF 50 MCG/ACT nasal spray Place 1-2 sprays into both nostrils 2 (two) times daily as needed for allergies or rhinitis.   fluticasone-salmeterol (ADVAIR HFA) 230-21 MCG/ACT inhaler Inhale 2 puffs into the lungs 2 (two) times daily.   losartan (COZAAR) 100 MG tablet TAKE 1 TABLET BY MOUTH EVERY DAY   montelukast (SINGULAIR) 10 MG tablet Take 1 tablet (10 mg total) by mouth at bedtime.   rosuvastatin (CRESTOR) 20 MG tablet Take by mouth.   [DISCONTINUED] atorvastatin (LIPITOR) 20 MG tablet TAKE 1 TABLET BY MOUTH EVERY DAY   [DISCONTINUED] buPROPion (WELLBUTRIN XL) 150 MG 24 hr tablet TAKE 1 TABLET BY MOUTH EVERY DAY   [DISCONTINUED] escitalopram (LEXAPRO) 10 MG tablet Take 1 tablet (10 mg total) by mouth daily.   atorvastatin (LIPITOR) 20 MG tablet Take 1 tablet (20 mg total) by mouth daily.   buPROPion (WELLBUTRIN XL) 150 MG 24 hr tablet Take 1 tablet (150 mg total) by mouth daily.   escitalopram (LEXAPRO) 10 MG tablet Take 1 tablet (10 mg total) by mouth daily.   No  facility-administered encounter medications on file as of 01/09/2023.    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   Depression    Fatty liver    History of kidney stones    Hypercholesteremia     Past Surgical History:  Procedure Laterality Date   BIOPSY  04/13/2021   Procedure: BIOPSY;  Surgeon: Charlott Holler, MD;  [facility]: [facility];  Service: Pulmonary;;   BRONCHIAL BRUSHINGS  04/13/2021   Procedure: BRONCHIAL BRUSHINGS;  Surgeon: Charlott Holler, MD;  [facility]: [facility];  Service: Pulmonary;;   BRONCHIAL WASHINGS  04/13/2021   Procedure: BRONCHIAL WASHINGS;  Surgeon: Charlott Holler, MD;  [facility];  Service: Pulmonary;;   INTERCOSTAL NERVE BLOCK Left 06/13/2021   Procedure: INTERCOSTAL NERVE BLOCK;  Surgeon: Loreli Slot, MD;  Location: [facility] OR;  Service: Thoracic;  Laterality: Left;   LASER BRONCHOSCOPY N/A 06/13/2021   Procedure: LASER BRONCHOSCOPY;  Surgeon: Loreli Slot, MD;  Location[facility];  Service: Thoracic;  Laterality: N/A;   NODE DISSECTION Left 06/13/2021   Procedure: NODE DISSECTION;  Surgeon: Loreli Slot, MD;  Location: [facility] OR;  Service: Thoracic;  Laterality: Left;   VIDEO BRONCHOSCOPY N/A 04/13/2021  Procedure: VIDEO BRONCHOSCOPY WITHOUT FLUORO;  Surgeon: Charlott Holler, MD;  Location: [facility];  Service: Pulmonary;  Laterality: N/A;   VIDEO BRONCHOSCOPY N/A 06/13/2021   Procedure: VIDEO BRONCHOSCOPY;  Surgeon: Loreli Slot, MD;  Location: [facility] OR;  Service: Thoracic;  Laterality: N/A;   WISDOM TOOTH EXTRACTION      Family History  Problem Relation Age of Onset   Prostate cancer Father    Asthma Son     Social History   Socioeconomic History   Marital status: Divorced    Spouse name: Not on file   Number of children: 3   Years of education: 12   Highest  education level: Not on file  Occupational History   Not on file  Tobacco Use   Smoking status: Never   Smokeless tobacco: Never  Vaping Use   Vaping status: Never Used  Substance and Sexual Activity   Alcohol use: Yes    Comment: socially   Drug use: Never   Sexual activity: Not Currently  Other Topics Concern   Not on file  Social History Narrative   Not on file   Social Determinants of Health   Financial Resource Strain: Low Risk  (01/09/2023)   Overall Financial Resource Strain (CARDIA)    Difficulty of Paying Living Expenses: Not hard at all  Food Insecurity: No Food Insecurity (01/09/2023)   Hunger Vital Sign    Worried About Running Out of Food in the Last Year: Never true    Ran Out of Food in the Last Year: Never true  Transportation Needs: No Transportation Needs (01/09/2023)   PRAPARE - Administrator, Civil Service (Medical): No    Lack of Transportation (Non-Medical): No  Physical Activity: Sufficiently Active (01/09/2023)   Exercise Vital Sign    Days of Exercise per Week: 5 days    Minutes of Exercise per Session: 30 min  Stress: Stress Concern Present (01/09/2023)   [employer] of Occupational Health - Occupational Stress Questionnaire    Feeling of Stress : To some extent  Social Connections: Moderately Integrated (01/09/2023)   Social Connection and Isolation Panel [NHANES]    Frequency of Communication with Friends and Family: More than three times a week    Frequency of Social Gatherings with Friends and Family: More than three times a week    Attends Religious Services: 1 to 4 times per year    Active Member of [employer] or Organizations: Yes    Attends Banker Meetings: More than 4 times per year    Marital Status: Divorced  Intimate Partner Violence: Not At Risk (01/09/2023)   Humiliation, Afraid, Rape, and Kick questionnaire    Fear of Current or Ex-Partner: No    Emotionally Abused: No    Physically Abused: No     Sexually Abused: No    Review of Systems  All other systems reviewed and are negative.       Objective    BP 121/86 (BP Location: Right Arm, Patient Position: Sitting, Cuff Size: Large)   Pulse 86   Temp 98.4 F (36.9 C) (Oral)   Resp 16   Ht 5\' 10"  (1.778 m)   Wt 242 lb (109.8 kg)   SpO2 96%   BMI 34.72 kg/m   Physical Exam Vitals and nursing note reviewed.  Constitutional:      General: He is not in acute distress. Cardiovascular:     Rate and Rhythm: Normal rate and regular rhythm.  Pulmonary:  Effort: Pulmonary effort is normal.     Breath sounds: Normal breath sounds.  Abdominal:     Palpations: Abdomen is soft.     Tenderness: There is no abdominal tenderness.  Neurological:     General: No focal deficit present.     Mental Status: He is alert and oriented to person, place, and time.  Psychiatric:        Mood and Affect: Mood normal.        Behavior: Behavior normal.         Assessment & Plan:   Essential hypertension  GAD (generalized anxiety disorder) -     buPROPion HCl ER (XL); Take 1 tablet (150 mg total) by mouth daily.  Dispense: 90 tablet; Refill: 0  Class 1 obesity due to excess calories with serious comorbidity and body mass index (BMI) of 34.0 to 34.9 in adult  Screening for lipid disorders  Encounter for immunization -     Flu vaccine trivalent PF, 6mos and older(Flulaval,Afluria,Fluarix,Fluzone)  Other orders -     Escitalopram Oxalate; Take 1 tablet (10 mg total) by mouth daily.  Dispense: 30 tablet; Refill: 1 -     Atorvastatin Calcium; Take 1 tablet (20 mg total) by mouth daily.  Dispense: 90 tablet; Refill: 1     Return in about 6 months (around 07/09/2023) for follow up.   Tommie Raymond, MD

## 2023-01-17 ENCOUNTER — Other Ambulatory Visit: Payer: Self-pay | Admitting: Family Medicine

## 2023-02-09 ENCOUNTER — Other Ambulatory Visit: Payer: Self-pay | Admitting: Family Medicine

## 2023-02-22 ENCOUNTER — Ambulatory Visit: Payer: BC Managed Care – PPO | Admitting: Pulmonary Disease

## 2023-02-23 ENCOUNTER — Ambulatory Visit: Payer: BC Managed Care – PPO | Admitting: Pulmonary Disease

## 2023-02-23 ENCOUNTER — Other Ambulatory Visit: Payer: Self-pay | Admitting: Internal Medicine

## 2023-02-23 NOTE — Progress Notes (Deleted)
Synopsis: Acute Visit, Dr. Celine Mans patient with asthma and OSA  Subjective:   PATIENT ID: Derek Fitzgerald GENDER: male DOB: 08-16-1980, MRN: [id number]   HPI  No chief complaint on file.  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   His current regiment includes advair HFA 230-75mcg 2 puffs twice daily, as needed albuterol and montelukast daily.     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   Depression    Fatty liver    History of kidney stones    Hypercholesteremia      Family History  Problem Relation Age of Onset   Prostate cancer Father    Asthma Son      Social History   Socioeconomic History   Marital status: Divorced    Spouse name: Not on file   Number of children: 3   Years of education: 12   Highest education level: Not on file  Occupational History   Not on file  Tobacco Use   Smoking status: Never   Smokeless tobacco: Never  Vaping Use   Vaping status: Never Used  Substance and Sexual Activity   Alcohol use: Yes    Comment: socially   Drug use: Never   Sexual activity: Not Currently  Other Topics Concern   Not on file  Social History Narrative   Not on file   Social Drivers of Health   Financial Resource Strain: Low Risk  (01/09/2023)   Overall Financial Resource Strain (CARDIA)    Difficulty of Paying Living Expenses: Not hard at all  Food Insecurity: No Food Insecurity (01/09/2023)   Hunger Vital Sign    Worried About Running Out of Food in the Last Year: Never true    Ran Out of Food in the Last Year: Never true  Transportation Needs: No Transportation Needs (01/09/2023)   PRAPARE - Administrator, Civil Service (Medical): No    Lack of  Transportation (Non-Medical): No  Physical Activity: Sufficiently Active (01/09/2023)   Exercise Vital Sign    Days of Exercise per Week: 5 days    Minutes of Exercise per Session: 30 min  Stress: Stress Concern Present (01/09/2023)   [employer] of Occupational Health - Occupational Stress Questionnaire    Feeling of Stress : To some extent  Social Connections: Moderately Integrated (01/09/2023)   Social Connection and Isolation Panel [NHANES]    Frequency of Communication with Friends and Family: More than three times a week    Frequency of Social Gatherings with Friends and Family: More than three times a week    Attends Religious Services: 1 to 4 times per year    Active Member of [employer] or Organizations: Yes    Attends Engineer, structural: More than 4 times per year    Marital Status: Divorced  Intimate Partner Violence: Not At Risk (01/09/2023)   Humiliation, Afraid, Rape, and Kick questionnaire    Fear of Current or Ex-Partner: No    Emotionally Abused: No    Physically Abused: No    Sexually Abused: No     Allergies  Allergen Reactions   Other Other (See Comments) and Swelling    Animal  dander- Eyes swell   Tree Pollen-white Birch (betula [place]ru[place]a)- tested allergic  Eyes swell up     Outpatient Medications Prior to Visit  Medication Sig Dispense Refill   acetaminophen (TYLENOL) 325 MG tablet Take 650 mg by mouth every 6 (six) hours as needed for moderate pain.     albuterol (VENTOLIN HFA) 108 (90 Base) MCG/ACT inhaler Inhale 1-2 puffs into the lungs every 6 (six) hours as needed for wheezing or shortness of breath. 1 each 5   atorvastatin (LIPITOR) 20 MG tablet Take 1 tablet (20 mg total) by mouth daily. 90 tablet 1   benzonatate (TESSALON) 100 MG capsule Take 1 capsule (100 mg total) by mouth every 8 (eight) hours as needed for cough. 21 capsule 0   buPROPion (WELLBUTRIN XL) 150 MG 24 hr tablet Take 1 tablet (150 mg total) by mouth daily. 90 tablet 0    CLARITIN 10 MG tablet Take 10 mg by mouth daily as needed for allergies or rhinitis.     escitalopram (LEXAPRO) 10 MG tablet TAKE 1 TABLET BY MOUTH EVERY DAY 90 tablet 0   FLONASE ALLERGY RELIEF 50 MCG/ACT nasal spray Place 1-2 sprays into both nostrils 2 (two) times daily as needed for allergies or rhinitis.     fluticasone-salmeterol (ADVAIR HFA) 230-21 MCG/ACT inhaler Inhale 2 puffs into the lungs 2 (two) times daily. 1 each 3   losartan (COZAAR) 100 MG tablet TAKE 1 TABLET BY MOUTH EVERY DAY 90 tablet 0   mirtazapine (REMERON) 30 MG tablet TAKE 1 TABLET BY MOUTH AT BEDTIME. 90 tablet 1   montelukast (SINGULAIR) 10 MG tablet Take 1 tablet (10 mg total) by mouth at bedtime. 30 tablet 11   rosuvastatin (CRESTOR) 20 MG tablet Take by mouth.     No facility-administered medications prior to visit.    ROS    Objective:  There were no vitals filed for this visit.   Physical Exam    CBC    Component Value Date/Time   WBC 6.0 08/28/2022 1112   WBC 7.3 06/15/2021 0049   RBC 4.61 08/28/2022 1112   HGB 14.4 08/28/2022 1112   HGB 16.0 05/17/2022 1448   HCT 41.2 08/28/2022 1112   HCT 47.1 05/17/2022 1448   PLT 99 (L) 08/28/2022 1112   PLT CANCELED 05/17/2022 1448   MCV 89.4 08/28/2022 1112   MCV 88 05/17/2022 1448   MCH 31.2 08/28/2022 1112   MCHC 35.0 08/28/2022 1112   RDW 12.6 08/28/2022 1112   RDW 12.9 05/17/2022 1448   LYMPHSABS 3.2 08/28/2022 1112   LYMPHSABS 3.3 (H) 05/17/2022 1448   MONOABS 0.6 08/28/2022 1112   EOSABS 0.2 08/28/2022 1112   EOSABS 0.4 05/17/2022 1448   BASOSABS 0.0 08/28/2022 1112   BASOSABS 0.1 05/17/2022 1448     Chest imaging:  PFT:    Latest Ref Rng & Units 04/21/2021    1:06 PM  PFT Results  FVC-Pre L 3.92   FVC-Predicted Pre % 92   FVC-Post L 4.03   FVC-Predicted Post % 94   Pre FEV1/FVC % % 72   Post FEV1/FCV % % 75   FEV1-Pre L 2.83   FEV1-Predicted Pre % 81   FEV1-Post L 3.03     Labs:  Path:  Echo:  Heart  Catheterization:       Assessment & Plan:   No diagnosis found.  Discussion: ***    Current Outpatient Medications:    acetaminophen (TYLENOL) 325 MG tablet, Take 650 mg by  mouth every 6 (six) hours as needed for moderate pain., Disp: , Rfl:    albuterol (VENTOLIN HFA) 108 (90 Base) MCG/ACT inhaler, Inhale 1-2 puffs into the lungs every 6 (six) hours as needed for wheezing or shortness of breath., Disp: 1 each, Rfl: 5   atorvastatin (LIPITOR) 20 MG tablet, Take 1 tablet (20 mg total) by mouth daily., Disp: 90 tablet, Rfl: 1   benzonatate (TESSALON) 100 MG capsule, Take 1 capsule (100 mg total) by mouth every 8 (eight) hours as needed for cough., Disp: 21 capsule, Rfl: 0   buPROPion (WELLBUTRIN XL) 150 MG 24 hr tablet, Take 1 tablet (150 mg total) by mouth daily., Disp: 90 tablet, Rfl: 0   CLARITIN 10 MG tablet, Take 10 mg by mouth daily as needed for allergies or rhinitis., Disp: , Rfl:    escitalopram (LEXAPRO) 10 MG tablet, TAKE 1 TABLET BY MOUTH EVERY DAY, Disp: 90 tablet, Rfl: 0   FLONASE ALLERGY RELIEF 50 MCG/ACT nasal spray, Place 1-2 sprays into both nostrils 2 (two) times daily as needed for allergies or rhinitis., Disp: , Rfl:    fluticasone-salmeterol (ADVAIR HFA) 230-21 MCG/ACT inhaler, Inhale 2 puffs into the lungs 2 (two) times daily., Disp: 1 each, Rfl: 3   losartan (COZAAR) 100 MG tablet, TAKE 1 TABLET BY MOUTH EVERY DAY, Disp: 90 tablet, Rfl: 0   mirtazapine (REMERON) 30 MG tablet, TAKE 1 TABLET BY MOUTH AT BEDTIME., Disp: 90 tablet, Rfl: 1   montelukast (SINGULAIR) 10 MG tablet, Take 1 tablet (10 mg total) by mouth at bedtime., Disp: 30 tablet, Rfl: 11   rosuvastatin (CRESTOR) 20 MG tablet, Take by mouth., Disp: , Rfl:

## 2023-04-11 ENCOUNTER — Telehealth: Payer: Self-pay | Admitting: *Deleted

## 2023-04-11 NOTE — Telephone Encounter (Signed)
Patient has follow up appt. Tomorrow with Dr. Celine Mans. No download in [employer] for patients CPAP. Called [facility] [phone] ask them to fax a current download on pt. Before his f/u appt. Tomorrow. I was told they will take care of it. Asked them to put it ATTN: Dr. Celine Mans.

## 2023-04-12 ENCOUNTER — Encounter: Payer: Self-pay | Admitting: Internal Medicine

## 2023-04-12 ENCOUNTER — Telehealth: Payer: Self-pay | Admitting: *Deleted

## 2023-04-12 ENCOUNTER — Ambulatory Visit: Payer: BC Managed Care – PPO | Admitting: Internal Medicine

## 2023-04-12 VITALS — BP 138/84 | HR 89 | Temp 98.2°F | Ht 69.5 in | Wt 243.4 lb

## 2023-04-12 DIAGNOSIS — G4733 Obstructive sleep apnea (adult) (pediatric): Secondary | ICD-10-CM | POA: Diagnosis not present

## 2023-04-12 DIAGNOSIS — J301 Allergic rhinitis due to pollen: Secondary | ICD-10-CM

## 2023-04-12 DIAGNOSIS — J4540 Moderate persistent asthma, uncomplicated: Secondary | ICD-10-CM

## 2023-04-12 DIAGNOSIS — Z86012 Personal history of benign carcinoid tumor: Secondary | ICD-10-CM | POA: Diagnosis not present

## 2023-04-12 MED ORDER — FLUTICASONE-SALMETEROL 250-50 MCG/ACT IN AEPB: 1.0000 | INHALATION_SPRAY | Freq: Two times a day (BID) | RESPIRATORY_TRACT | 5 refills | Status: AC

## 2023-04-12 MED ORDER — MONTELUKAST SODIUM 10 MG PO TABS: 10.0000 mg | ORAL_TABLET | Freq: Every day | ORAL | 3 refills | Status: AC

## 2023-04-12 MED ORDER — FLUTICASONE-SALMETEROL 230-21 MCG/ACT IN AERO: 2.0000 | INHALATION_SPRAY | Freq: Two times a day (BID) | RESPIRATORY_TRACT | 3 refills | Status: DC

## 2023-04-12 NOTE — Telephone Encounter (Signed)
Called and left a message on voicemail for patient to bring his SD card and/or machine from his CPAP to his ov today with Dr. Celine Mans.

## 2023-04-12 NOTE — Progress Notes (Signed)
Derek Fitzgerald    [id number]    [birth year]  Primary Care Physician:Wilson, Lauris Poag, MD Date of Appointment: 04/12/2023 Established Patient Visit  Chief complaint:   Chief Complaint  Patient presents with   Follow-up    Pain in area where lobectomy was performed     HPI: Derek Fitzgerald is a 43 y.o. man with moderate persistent asthma and endobronchial carcinoid tumor s/p LUL lobectomy with Dr. Dorris Fetch in April 2023. Also OSA not on CPAP  Interval Updates: Here for follow up for asthma and OSA. Taking advair as needed but symptoms not well controlled. He has now run out of his advair.  No interval exacerbations or ED visits.   Current Regimen: albuterol prn, intermittent ICS.  Asthma Triggers: exertion, talking  Exacerbations in the last year: none since Jan 2022 History of hospitalization or intubation: none Allergy Testing: none GERD: denies Allergic Rhinitis: yes on montelukast.  ACT:  Asthma Control Test ACT Total Score  04/12/2023  9:48 AM 18  06/07/2022  3:30 PM 24  12/07/2021  4:06 PM 18   I have reviewed the patient's family social and past medical history and updated as appropriate.   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   Depression    Fatty liver    History of kidney stones    Hypercholesteremia     Past Surgical History:  Procedure Laterality Date   BIOPSY  04/13/2021   Procedure: BIOPSY;  Surgeon: Charlott Holler, MD;  Location: [facility];  Service: Pulmonary;;   BRONCHIAL BRUSHINGS  04/13/2021   Procedure: BRONCHIAL BRUSHINGS;  Surgeon: Charlott Holler, MD;  Location: [facility];  Service: Pulmonary;;   BRONCHIAL WASHINGS  04/13/2021   Procedure: BRONCHIAL WASHINGS;  Surgeon: Charlott Holler, MD;  Location: [facility];  Service: Pulmonary;;   INTERCOSTAL NERVE BLOCK Left 06/13/2021    Procedure: INTERCOSTAL NERVE BLOCK;  Surgeon: Loreli Slot, MD;  Location: [facility] OR;  Service: Thoracic;  Laterality: Left;   LASER BRONCHOSCOPY N/A 06/13/2021   Procedure: LASER BRONCHOSCOPY;  Surgeon: Loreli Slot, MD;  Location[facility] OR;  Service: Thoracic;  Laterality: N/A;   NODE DISSECTION Left 06/13/2021   Procedure: NODE DISSECTION;  Surgeon: Loreli Slot, MD;  Location: [facility] OR;  Service: Thoracic;  Laterality: Left;   VIDEO BRONCHOSCOPY N/A 04/13/2021   Procedure: VIDEO BRONCHOSCOPY WITHOUT FLUORO;  Surgeon: Charlott Holler, MD;  Location[facility];  Service: Pulmonary;  Laterality: N/A;   VIDEO BRONCHOSCOPY N/A 06/13/2021   Procedure: VIDEO BRONCHOSCOPY;  Surgeon: Loreli Slot, MD;  Location: [facility] OR;  Service: Thoracic;  Laterality: N/A;   WISDOM TOOTH EXTRACTION      Family History  Problem Relation Age of Onset   Prostate cancer Father    Asthma Son     Social History   Occupational History   Not on file  Tobacco Use   Smoking status: Never   Smokeless tobacco: Never  Vaping Use   Vaping status: Never Used  Substance and Sexual Activity   Alcohol use: Yes    Comment: socially   Drug use: Never   Sexual activity: Not Currently     Physical Exam: Blood pressure 138/84, pulse 89, temperature 98.2 F (36.8 C), temperature source Oral, height 5'  9.5" (1.765 m), weight 243 lb 6.4 oz (110.4 kg), SpO2 97%.  Gen:      No acute distress Lungs:    ctab no wheeze CV:         RRR no mrg   Data Reviewed: Imaging: I have personally reviewed the chest xray June 2023 = no acute process  PFTs:     Latest Ref Rng & Units 04/21/2021    1:06 PM  PFT Results  FVC-Pre L 3.92   FVC-Predicted Pre % 92   FVC-Post L 4.03   FVC-Predicted Post % 94   Pre FEV1/FVC % % 72   Post FEV1/FCV % % 75   FEV1-Pre L 2.83   FEV1-Predicted Pre % 81   FEV1-Post L 3.03    I have personally reviewed the patient's PFTs and normal pulmonary function.    Labs: Lab Results  Component Value Date   WBC 6.0 08/28/2022   HGB 14.4 08/28/2022   HCT 41.2 08/28/2022   MCV 89.4 08/28/2022   PLT 99 (L) 08/28/2022   Lab Results  Component Value Date   NA 138 08/28/2022   K 3.8 08/28/2022   CL 107 08/28/2022   CO2 25 08/28/2022    Immunization status: Immunization History  Administered Date(s) Administered   Influenza, Seasonal, Injecte, Preservative Fre 01/09/2023    External Records Personally Reviewed:   Assessment:  Moderate persistent asthma not well controlled Allergic rhinitis LUL endobronchial carcinoid s/p lobectomy in remission OSA not on CPAP therapy.   Plan/Recommendations:  Your asthma is not well controlled. Start taking the advair regularly again. 1 puff twice daily, gargle after use.  Insurance recommends switching to advair Continue montelukast/singulair Continue albuterol inhaler as needed.  Let me know when you want to try CPAP again.   Return to Care: Return in about 6 months (around 10/10/2023).   Durel Salts, MD Pulmonary and Critical Care Medicine [facility] [phone]

## 2023-04-12 NOTE — Patient Instructions (Addendum)
It was a pleasure to see you today!  Please schedule follow up scheduled with myself in 6 months.  If my schedule is not open yet, we will contact you with a reminder closer to that time. Please call [phone] if you haven't heard from [place] a month before, and always call us sooner if issues or concerns arise. You can also send [place] a message through MyChart, but but aware that this is not to be used for urgent issues and it may take up to 5-7 days to receive a reply. Please be aware that you will likely be able to view your results before I have a chance to respond to them. Please give [place] 5 business days to respond to any non-urgent results.   Your asthma is not well controlled. Start taking the advair regularly again. 1 puff twice daily, gargle after use.  Your insurance recommends switching to wixela which is a generic form of advair.  Continue montelukast/singulair Continue albuterol inhaler as needed.  Let me know when you want to try CPAP again.

## 2023-05-10 ENCOUNTER — Other Ambulatory Visit: Payer: Self-pay | Admitting: Family Medicine

## 2023-05-10 NOTE — Telephone Encounter (Signed)
 Requested Prescriptions  Pending Prescriptions Disp Refills   escitalopram (LEXAPRO) 10 MG tablet [Pharmacy Med Name: ESCITALOPRAM 10 MG TABLET] 90 tablet 0    Sig: TAKE 1 TABLET BY MOUTH EVERY DAY     Psychiatry:  Antidepressants - SSRI Passed - 05/10/2023 12:58 PM      Passed - Valid encounter within last 6 months    Recent Outpatient Visits           4 months ago Essential hypertension   [facility], [place]   11 months ago Annual physical exam   [facility], MD   1 year ago Essential hypertension   [facility], MD   1 year ago Essential hypertension   [facility], MD   1 year ago Essential hypertension   [facility], [place]

## 2023-06-06 ENCOUNTER — Ambulatory Visit: Payer: Self-pay | Admitting: Internal Medicine

## 2023-06-06 NOTE — Telephone Encounter (Signed)
 Agree. thanks

## 2023-06-06 NOTE — Telephone Encounter (Signed)
 Copied from CRM [id number]. Topic: Clinical - Red Word Triage >> Jun 06, 2023  1:46 PM Orinda Kenner C wrote: Red Word that prompted transfer to Nurse Triage: Patient [phone] states having trouble with breathing and lungs, bad wheezing, having issues to breath, pain in the left lung, and light headed. Patient denies a fever. Patient wants to be seen to rule out pneumonia. Please advise.  TRIAGE SUMMARY NOTE: Pt reporting that he has been having "a lot more" SOB than usual for the past couple weeks, also having tight, continual coughing fits, pt stuck coughing every minute or so on the phone with nurse, coughing up some yellow sputum. Pt also reporting chest tightness like "if pull a muscle, feels like that" in chest on left side and "a little bit under my armpit area." Pt confirms SOB worsens with exertion, also gets rather lightheaded with a strong coughing fit, stating he gets "good and fuzzy" in his head, has to stop and sit. Pt reporting that he's been using his advair as prescribed but has not used his rescue inhaler at all due to misplacing it, no nebulizer either. Pt confirms he is wheezing, hears abnormal breath sounds "mostly when inhaling," nurse mimicked stridor for pt and pt confirmed he has this at times. Advised pt go to hospital right away, advised 911. Pt states he is currently driving to pick up his kids from school. Advised pt have another adult drive, pull over if more SOB or dizzy. Advised call 911 especially if worsening, can call back if worsening, advised get to hospital asap for immediate care. Pt verbalized understanding. Please advise for follow up care.  E2C2 Pulmonary Triage - Initial Assessment Questions "Chief Complaint (e.g., cough, sob, wheezing, fever, chills, sweat or additional symptoms) *Go to specific symptom protocol after initial questions. Cough, coughing up little bit but yellowish sputum, no blood Bit of a coughing fit right now, been happening for couple weeks Can  feel the wheeze in my chest A lot more SOB than normal than usual Muscle is tight in chest might be just from coughing, very mild 1-2/10, enough to notice, like if pull a muscle feels like that, feels sore, in my chest a little bit under my armpit area somewhere in there, not worse with exertion If walking get more winded Lightheaded after a good coughing fit, get good and fuzzy Wheezing, stridor at times, hearing harsh sounds while inhaling This morning felt fine then throughout day just got worse Denies struggling to breathe, just a tightness hard to describe I guess A little bit worse throughout the day, then subsides a bit later on Breathing harder than usual, lightheadedness BP regulated during this time Wheezing not right after new med or exposure to allergen  "How long have symptoms been present?" Couple weeks  Have you tested for COVID or Flu? Note: If not, ask patient if a home test can be taken. If so, instruct patient to call back for positive results. No  MEDICINES:   "Have you used any OTC meds to help with symptoms?" Yes If yes, ask "What medications?" sudafed  "Have you used your inhalers/maintenance medication?" Yes If yes, "What medications?" Advair, misplaced his rescue inhaler  OXYGEN: "Do you wear supplemental oxygen?" No  "Do you monitor your oxygen levels?" No  Reason for Disposition  Stridor (harsh sound while breathing in)  Answer Assessment - Initial Assessment Questions 6. CARDIAC HISTORY: "Do you have any history of heart disease?" (e.g., heart attack, angina, bypass surgery, angioplasty)  HTN 7. LUNG HISTORY: "Do you have any history of lung disease?"  (e.g., pulmonary embolus, asthma, emphysema)     Lung nodule  Protocols used: Breathing Difficulty-A-AH

## 2023-08-02 ENCOUNTER — Other Ambulatory Visit: Payer: Self-pay | Admitting: Family Medicine

## 2023-08-02 NOTE — Telephone Encounter (Signed)
 Please ask patient to schedule an appt with Dr Elvan Hamel

## 2023-08-17 ENCOUNTER — Other Ambulatory Visit: Payer: Self-pay | Admitting: Family Medicine

## 2023-09-03 ENCOUNTER — Inpatient Hospital Stay: Payer: BC Managed Care – PPO | Attending: Internal Medicine

## 2023-09-03 ENCOUNTER — Ambulatory Visit (HOSPITAL_COMMUNITY)
Admission: RE | Admit: 2023-09-03 | Discharge: 2023-09-03 | Disposition: A | Discharge status: Home / Self Care | Source: Ambulatory Visit | Attending: Internal Medicine | Admitting: Internal Medicine

## 2023-09-03 DIAGNOSIS — C7A090 Malignant carcinoid tumor of the bronchus and lung: Secondary | ICD-10-CM | POA: Insufficient documentation

## 2023-09-03 DIAGNOSIS — Z902 Acquired absence of lung [part of]: Secondary | ICD-10-CM | POA: Insufficient documentation

## 2023-09-03 DIAGNOSIS — Z8042 Family history of malignant neoplasm of prostate: Secondary | ICD-10-CM | POA: Insufficient documentation

## 2023-09-03 DIAGNOSIS — R55 Syncope and collapse: Secondary | ICD-10-CM | POA: Insufficient documentation

## 2023-09-03 DIAGNOSIS — R0602 Shortness of breath: Secondary | ICD-10-CM | POA: Diagnosis not present

## 2023-09-03 LAB — CMP (CANCER CENTER ONLY)
ALT: 66 U/L — ABNORMAL HIGH (ref 0–44)
AST: 28 U/L (ref 15–41)
Albumin: 4.2 g/dL (ref 3.5–5.0)
Alkaline Phosphatase: 65 U/L (ref 38–126)
Anion gap: 5 (ref 5–15)
BUN: 13 mg/dL (ref 6–20)
CO2: 27 mmol/L (ref 22–32)
Calcium: 9 mg/dL (ref 8.9–10.3)
Chloride: 109 mmol/L (ref 98–111)
Creatinine: 1.09 mg/dL (ref 0.61–1.24)
GFR, Estimated: >60 mL/min (ref >60)
Glucose, Bld: 97 mg/dL (ref 70–99)
Potassium: 4 mmol/L (ref 3.5–5.1)
Sodium: 141 mmol/L (ref 135–145)
Total Bilirubin: 0.7 mg/dL (ref 0.0–1.2)
Total Protein: 6.8 g/dL (ref 6.5–8.1)

## 2023-09-03 LAB — CBC WITH DIFFERENTIAL (CANCER CENTER ONLY)
Abs Immature Granulocytes: 0.01 K/uL (ref 0.00–0.07)
Basophils Absolute: 0 K/uL (ref 0.0–0.1)
Basophils Relative: 0 %
Eosinophils Absolute: 0.2 K/uL (ref 0.0–0.5)
Eosinophils Relative: 4 %
HCT: 44.1 % (ref 39.0–52.0)
Hemoglobin: 14.9 g/dL (ref 13.0–17.0)
Immature Granulocytes: 0 %
Lymphocytes Relative: 49 %
Lymphs Abs: 2.9 K/uL (ref 0.7–4.0)
MCH: 29.4 pg (ref 26.0–34.0)
MCHC: 33.8 g/dL (ref 30.0–36.0)
MCV: 87.2 fL (ref 80.0–100.0)
Monocytes Absolute: 0.5 K/uL (ref 0.1–1.0)
Monocytes Relative: 9 %
Neutro Abs: 2.2 K/uL (ref 1.7–7.7)
Neutrophils Relative %: 38 %
Platelet Count: 179 K/uL (ref 150–400)
RBC: 5.06 MIL/uL (ref 4.22–5.81)
RDW: 12.6 % (ref 11.5–15.5)
WBC Count: 5.7 K/uL (ref 4.0–10.5)
nRBC: 0 % (ref 0.0–0.2)

## 2023-09-03 MED ORDER — IOHEXOL 300 MG/ML  SOLN
75.0000 mL | Freq: Once | INTRAMUSCULAR | Status: AC | PRN
Start: 2023-09-03 — End: 2023-09-03
  Administered 2023-09-03: 75 mL via INTRAVENOUS

## 2023-09-04 ENCOUNTER — Telehealth: Payer: Self-pay | Admitting: Internal Medicine

## 2023-09-04 NOTE — Progress Notes (Unsigned)
 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 Kerrin.   CURRENT THERAPY: Observation   INTERVAL HISTORY: Derek Fitzgerald 43 y.o. male returns to the clinic today for a follow up visit. The patient was last seen in the clinic on 09/07/22. He is on observation for his history of stage I lung cancer which was resected in 2023.SABRA He denies fevers, chills, night sweats, or unexplained weight loss. He sometimes gets shortness of breath and will use his inhaler. He denies chest pain, significant cough, or hemoptysis. He denies nausea or vomiting. He mentions having a syncopal episode after stretching. He denies headaches or vision changes. He recently had a restaging CT scan. He is here for evaluation and to review his scan result.    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  MEDICATIONS:  Current Outpatient Medications  Medication Sig Dispense Refill   acetaminophen  (TYLENOL ) 325 MG tablet Take 650 mg by mouth every 6 (six) hours as needed for moderate pain.     albuterol  (VENTOLIN  HFA) 108 (90 Base) MCG/ACT inhaler Inhale 1-2 puffs into the lungs every 6 (six) hours as needed for wheezing or shortness of breath. 1 each 5   atorvastatin  (LIPITOR) 20 MG tablet TAKE 1 TABLET BY MOUTH EVERY DAY 90 tablet 1   benzonatate  (TESSALON ) 100 MG capsule Take 1 capsule (100 mg total) by mouth every 8 (eight) hours as needed for cough. 21  capsule 0   buPROPion  (WELLBUTRIN  XL) 150 MG 24 hr tablet Take 1 tablet (150 mg total) by mouth daily. 90 tablet 0   CLARITIN 10 MG tablet Take 10 mg by mouth daily as needed for allergies or rhinitis.     escitalopram  (LEXAPRO ) 10 MG tablet TAKE 1 TABLET BY MOUTH EVERY DAY 90 tablet 0   FLONASE  ALLERGY  RELIEF 50 MCG/ACT nasal spray Place 1-2 sprays into both nostrils 2 (two) times daily as needed for allergies or rhinitis.     fluticasone -salmeterol (ADVAIR ) 250-50 MCG/ACT AEPB Inhale 1 puff into the lungs in the morning and at bedtime. 1 each 5   losartan  (COZAAR ) 100 MG tablet TAKE 1 TABLET BY MOUTH EVERY DAY 90 tablet 0   mirtazapine  (REMERON ) 30 MG tablet TAKE 1 TABLET BY MOUTH EVERYDAY AT BEDTIME 90 tablet 0   montelukast  (SINGULAIR ) 10 MG tablet Take 1 tablet (10 mg total) by mouth daily. 90 tablet 3   rosuvastatin (CRESTOR) 20 MG tablet Take by mouth.     No current facility-administered medications for this visit.    SURGICAL HISTORY:  Past Surgical History:  Procedure Laterality Date   BIOPSY  04/13/2021   Procedure: BIOPSY;  Surgeon: Meade Verdon RAMAN, MD;  Location: [facility];  Service: Pulmonary;;   BRONCHIAL BRUSHINGS  04/13/2021   Procedure: BRONCHIAL BRUSHINGS;  Surgeon: Meade Verdon RAMAN, MD;  Location: [facility];  Service: Pulmonary;;  BRONCHIAL WASHINGS  04/13/2021   Procedure: BRONCHIAL WASHINGS;  Surgeon: Meade Verdon RAMAN, MD;  Location: [facility];  Service: Pulmonary;;   INTERCOSTAL NERVE BLOCK Left 06/13/2021   Procedure: INTERCOSTAL NERVE BLOCK;  Surgeon: Kerrin Elspeth BROCKS, MD;  Location: [facility] OR;  Service: Thoracic;  Laterality: Left;   LASER BRONCHOSCOPY N/A 06/13/2021   Procedure: LASER BRONCHOSCOPY;  Surgeon: Kerrin Elspeth BROCKS, MD;  Location: [facility] OR;  Service: Thoracic;  Laterality: N/A;   NODE DISSECTION Left 06/13/2021   Procedure: NODE DISSECTION;  Surgeon: Kerrin Elspeth BROCKS, MD;  Location: [facility] OR;  Service: Thoracic;  Laterality: Left;   VIDEO  BRONCHOSCOPY N/A 04/13/2021   Procedure: VIDEO BRONCHOSCOPY WITHOUT FLUORO;  Surgeon: Meade Verdon RAMAN, MD;  Location: [facility];  Service: Pulmonary;  Laterality: N/A;   VIDEO BRONCHOSCOPY N/A 06/13/2021   Procedure: VIDEO BRONCHOSCOPY;  Surgeon: Kerrin Elspeth BROCKS, MD;  Location: [facility] OR;  Service: Thoracic;  Laterality: N/A;   WISDOM TOOTH EXTRACTION      REVIEW OF SYSTEMS:   Review of Systems  Constitutional: Negative for appetite change, chills, fatigue, fever and unexpected weight change.  HENT: Negative for mouth sores, nosebleeds, sore throat and trouble swallowing.   Eyes: Negative for eye problems and icterus.  Respiratory: Positive for occasional shortness of breath. Negative for cough, hemoptysis, and wheezing.   Cardiovascular: Negative for chest pain and leg swelling.  Gastrointestinal: Negative for abdominal pain, constipation, diarrhea, nausea and vomiting.  Genitourinary: Negative for bladder incontinence, difficulty urinating, dysuria, frequency and hematuria.   Musculoskeletal: Negative for back pain, gait problem, neck pain and neck stiffness.  Skin: Negative for itching and rash.  Neurological: Negative for dizziness, extremity weakness, gait problem, headaches, light-headedness and seizures.  Hematological: Negative for adenopathy. Does not bruise/bleed easily.  Psychiatric/Behavioral: Negative for confusion, depression and sleep disturbance. The patient is not nervous/anxious.     PHYSICAL EXAMINATION:  There were no vitals taken for this visit.  ECOG PERFORMANCE STATUS: 0  Physical Exam  Constitutional: Oriented to person, place, and time and well-developed, well-nourished, and in no distress.  HENT:  Head: Normocephalic and atraumatic.  Mouth/Throat: Oropharynx is clear and moist. No oropharyngeal exudate.  Eyes: Conjunctivae are normal. Right eye exhibits no discharge. Left eye exhibits no discharge. No scleral icterus.  Neck: Normal range of motion. Neck  supple.  Cardiovascular: Normal rate, regular rhythm, normal heart sounds and intact distal pulses.   Pulmonary/Chest: Effort normal and breath sounds normal. No respiratory distress. No wheezes. No rales.  Abdominal: Soft. Bowel sounds are normal. Exhibits no distension and no mass. There is no tenderness.  Musculoskeletal: Normal range of motion. Exhibits no edema.  Lymphadenopathy:    No cervical adenopathy.  Neurological: Alert and oriented to person, place, and time. Exhibits normal muscle tone. Gait normal. Coordination normal.  Skin: Skin is warm and dry. No rash noted. Not diaphoretic. No erythema. No pallor.  Psychiatric: Mood, memory and judgment normal.  Vitals reviewed.  LABORATORY DATA: Lab Results  Component Value Date   WBC 5.7 09/03/2023   HGB 14.9 09/03/2023   HCT 44.1 09/03/2023   MCV 87.2 09/03/2023   PLT 179 09/03/2023      Chemistry      Component Value Date/Time   NA 141 09/03/2023 1204   NA 140 05/17/2022 1448   K 4.0 09/03/2023 1204   CL 109 09/03/2023 1204   CO2 27 09/03/2023 1204   BUN 13 09/03/2023 1204   BUN 11 05/17/2022 1448   CREATININE  1.09 09/03/2023 1204      Component Value Date/Time   CALCIUM  9.0 09/03/2023 1204   ALKPHOS 65 09/03/2023 1204   AST 28 09/03/2023 1204   ALT 66 (H) 09/03/2023 1204   BILITOT 0.7 09/03/2023 1204       RADIOGRAPHIC STUDIES:  CT Chest W Contrast Result Date: 09/03/2023 EXAM: CT CHEST WITH CONTRAST 09/03/2023 01:01:48 PM TECHNIQUE: CT of the chest was performed with the administration of intravenous contrast. Multiplanar reformatted images are provided for review. Automated exposure control, iterative reconstruction, and/or weight based adjustment of the mA/kV was utilized to reduce the radiation dose to as low as reasonably achievable. COMPARISON: 08/28/2022 CLINICAL HISTORY: Neuroendocrine tumor (NET). Malignant carcinoid tumor of bronchus and lung; NET tumor; Left lung cancer diagnosed 02/2021; left upper  lobectomy 05/2021; nonsmoker; no chemo/xrt; rare cough; shortness of breath on long walks. FINDINGS: MEDIASTINUM: Heart and pericardium are unremarkable. The central airways are clear. LYMPH NODES: No mediastinal, hilar or axillary lymphadenopathy. LUNGS AND PLEURA: Status post left upper lobectomy. No suspicious pulmonary nodules. No focal consolidation or pulmonary edema. No pleural effusion or pneumothorax. SOFT TISSUES/BONES: No acute abnormality of the bones or soft tissues. UPPER ABDOMEN: Limited images of the upper abdomen demonstrates no acute abnormality. IMPRESSION: 1. Status post left upper lobectomy. 2. No recurrent or metastatic disease. Electronically signed by: Pinkie Pebbles MD 09/03/2023 11:41 PM EDT RP Workstation: [id number]     ASSESSMENT/PLAN:  This is a very pleasant 43 year old male with a history of stage Ib (T2a, N0, M0) with well-differentiated neuroendocrine carcinoma/carcinoid tumor measuring 3.8 cm.  He is status post left upper lobectomy with lymph node sampling on 06/13/2021 under the care of Dr. Kerrin.  The patient is on observation and feeling fine.  Labs were reviewed.  Scan was reviewed which did not show any evidence of disease progression.  The patient will continue on observation with a repeat CT scan of the chest in 1 year.   We will see him back 1 year later to review the results.   I encouraged him to see his PCP for workup regarding the syncopal episode he had in the interval. He is well appearing and is not having any current concerns today. Should this happen again, he was advised to seek prompt medical evaluation.   The patient was advised to call immediately if he has any concerning symptoms in the interval. The patient voices understanding of current disease status and treatment options and is in agreement with the current care plan. All questions were answered. The patient knows to call the clinic with any problems, questions or concerns. We can  certainly see the patient much sooner if necessary   No orders of the defined types were placed in this encounter.    The total time spent in the appointment was 20-29 minutes  Loreena Valeri L Briann Sarchet, PA-C 09/04/23

## 2023-09-04 NOTE — Telephone Encounter (Signed)
 Attempted to inform the patient of the rescheduled appointment details. The patients voicemail was full. The patient was rescheduled to an earlier time of day.

## 2023-09-06 ENCOUNTER — Inpatient Hospital Stay: Admitting: Physician Assistant

## 2023-09-06 ENCOUNTER — Ambulatory Visit: Payer: BC Managed Care – PPO | Admitting: Internal Medicine

## 2023-09-06 VITALS — BP 138/80 | HR 74 | Temp 98.1°F | Resp 18 | Ht 69.5 in | Wt 240.0 lb

## 2023-09-06 DIAGNOSIS — C7A090 Malignant carcinoid tumor of the bronchus and lung: Secondary | ICD-10-CM

## 2023-12-07 ENCOUNTER — Other Ambulatory Visit: Payer: Self-pay | Admitting: Family Medicine

## 2023-12-10 ENCOUNTER — Ambulatory Visit (INDEPENDENT_AMBULATORY_CARE_PROVIDER_SITE_OTHER)

## 2023-12-10 ENCOUNTER — Ambulatory Visit
Admission: EM | Admit: 2023-12-10 | Discharge: 2023-12-10 | Disposition: A | Discharge status: Home / Self Care | Attending: Nurse Practitioner | Admitting: Nurse Practitioner

## 2023-12-10 ENCOUNTER — Encounter: Payer: Self-pay | Admitting: Emergency Medicine

## 2023-12-10 DIAGNOSIS — J206 Acute bronchitis due to rhinovirus: Secondary | ICD-10-CM

## 2023-12-10 DIAGNOSIS — R051 Acute cough: Secondary | ICD-10-CM

## 2023-12-10 DIAGNOSIS — Z85118 Personal history of other malignant neoplasm of bronchus and lung: Secondary | ICD-10-CM

## 2023-12-10 LAB — POC COVID19/FLU A&B COMBO
Covid Antigen, POC: NEGATIVE
Influenza A Antigen, POC: NEGATIVE
Influenza B Antigen, POC: NEGATIVE

## 2023-12-10 MED ORDER — PSEUDOEPH-BROMPHEN-DM 30-2-10 MG/5ML PO SYRP: 10.0000 mL | ORAL_SOLUTION | Freq: Four times a day (QID) | ORAL | 0 refills | Status: AC | PRN

## 2023-12-10 MED ORDER — MUCINEX DM MAXIMUM STRENGTH 60-1200 MG PO TB12: 1.0000 | ORAL_TABLET | Freq: Two times a day (BID) | ORAL | 0 refills | Status: AC

## 2023-12-10 MED ORDER — AMOXICILLIN-POT CLAVULANATE 875-125 MG PO TABS: 1.0000 | ORAL_TABLET | Freq: Two times a day (BID) | ORAL | 0 refills | Status: AC

## 2023-12-10 MED ORDER — AZITHROMYCIN 250 MG PO TABS: 250.0000 mg | ORAL_TABLET | Freq: Every day | ORAL | 0 refills | Status: AC

## 2023-12-10 MED ORDER — PREDNISONE 10 MG PO TABS: ORAL_TABLET | ORAL | 0 refills | Status: AC

## 2023-12-10 MED ORDER — IPRATROPIUM-ALBUTEROL 0.5-2.5 (3) MG/3ML IN SOLN
3.0000 mL | Freq: Once | RESPIRATORY_TRACT | Status: AC
  Administered 2023-12-10: 3 mL via RESPIRATORY_TRACT

## 2023-12-10 MED ORDER — ALBUTEROL SULFATE HFA 108 (90 BASE) MCG/ACT IN AERS: 2.0000 | INHALATION_SPRAY | RESPIRATORY_TRACT | 0 refills | Status: AC | PRN

## 2023-12-10 NOTE — ED Triage Notes (Signed)
 Pt c/o productive cough and sinus drainage x's 4 days  Also st's he has  been spitting up blood

## 2023-12-10 NOTE — Discharge Instructions (Addendum)
 You were seen today for a cough that has been present for the past few days. Your exam and tests, including a chest X-ray, flu test, and COVID test, did not show any signs of pneumonia or other serious lung problems. Your symptoms are most consistent with acute bronchitis, which is likely caused by a virus. This type of infection typically improves on its own with rest and supportive care.  You received a breathing treatment in the clinic that helped open your airways. Continue to rest, drink plenty of fluids, and use the prescribed medications as directed. Staying well hydrated helps thin mucus and makes it easier to cough up phlegm. Warm liquids such as tea or soup can soothe your throat. You may use over-the-counter cough medicine, throat lozenges, or honey (if not allergic) to ease coughing. Avoid smoking, vaping, or being around smoke or strong fumes, as these can make symptoms worse. Your cough may last for up to two or three weeks but should gradually improve. Follow up with your primary care provider if your symptoms do not start to improve within several days, or sooner if you experience worsening congestion, persistent fatigue, or any new symptoms. Go to the emergency department right away if you develop chest pain, shortness of breath that worsens or does not go away with rest, coughing up large amounts of blood, a persistent high fever, confusion, or if you feel generally weak or unwell.

## 2023-12-10 NOTE — ED Provider Notes (Signed)
 [name]    [name]: [id number] Arrival date & time: 12/10/23  1257      History   Chief Complaint Chief Complaint  Patient presents with   Cough    HPI ADEEB KONECNY is a 43 y.o. male.   Discussed the use of AI scribe software for clinical note transcription with the patient, who gave verbal consent to proceed.   The patient presents with a cough that began four days ago and reports feeling miserable since onset. The cough is productive of thick, dark yellow sputum with occasional blood-streaking and intermittent episodes of small amounts of bright red blood. The patient has a history of Stage Ib (T2a, N0, M0) well-differentiated neuroendocrine carcinoma, status post left upper lobectomy with lymph node sampling on June 13, 2021. He remains in remission and follows with oncology for annual chest CT surveillance, with the most recent scan in July 2024 showing no evidence of recurrent or metastatic disease.  Associated symptoms include headache, sore throat that was initially severe but has improved, nasal congestion, rhinorrhea, post-nasal drainage, intermittent nosebleeds, fatigue, and lightheadedness. The patient reports feeling warm but has not measured a fever. He continues to experience chronic shortness of breath attributed to prior lung surgery. He denies nausea, vomiting, diarrhea, constipation, chills, body aches, weight changes, or known sick contacts.  The patient has been taking Sudafed and Claritin with minimal relief. He has never smoked. Given his medical history, he expresses concern that the current symptoms may represent a recurrence or infection, recalling that his lung cancer was initially discovered following an episode of pneumonia.  The following sections of the patient's history were reviewed and updated as appropriate: allergies, current medications, past family history, past medical history, past social history, past surgical history, and problem  list.         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) 08/29/2021   Mixed hyperlipidemia 06/21/2021   GAD (generalized anxiety disorder) 06/21/2021   Elevated liver enzymes 06/21/2021   Hypokalemia 06/21/2021   Prediabetes 06/21/2021   Class 2 severe obesity due to excess calories with serious comorbidity and body mass index (BMI) of 35.0 to 35.9 in adult 06/21/2021   Fatty liver 06/21/2021   Essential hypertension 06/16/2021   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   Lung nodule 05/24/2021   History of COVID-19 03/11/2021   History of asthma 03/11/2021   Wheezing 03/11/2021   Elevated LDL cholesterol level 09/23/2015   Delayed ejaculation 09/21/2015   Healthcare maintenance 09/21/2015   Acute pain of both shoulders 08/30/2015   Lumbar strain 08/30/2015   Chronic diffuse otitis externa of both ears 05/20/2015   Acute eczematoid otitis externa of right ear 04/22/2015   Seasonal allergic rhinitis due to pollen 04/22/2015   Sleep disturbance 02/11/2014   Hematochezia 08/14/2013   Sciatica 08/14/2013   Reactive airway disease 07/25/2013   Pharyngitis 07/25/2013   Allergic rhinitis 06/03/2013   Acute maxillary sinusitis 01/17/2013   Family circumstance 01/17/2013   Acute laryngopharyngitis 08/31/2012   Elevated blood pressure reading 08/31/2012   Malaise and  fatigue 08/31/2012    Past Surgical History:  Procedure Laterality Date   BIOPSY  04/13/2021   Procedure: BIOPSY;  Surgeon: Meade Verdon RAMAN, MD;  Location: [facility];  Service: Pulmonary;;   BRONCHIAL BRUSHINGS  04/13/2021   Procedure: BRONCHIAL BRUSHINGS;  Surgeon: Meade Verdon RAMAN, MD;   Location: [facility];  Service: Pulmonary;;   BRONCHIAL WASHINGS  04/13/2021   Procedure: BRONCHIAL WASHINGS;  Surgeon: Meade Verdon RAMAN, MD;  Location: [facility];  Service: Pulmonary;;   INTERCOSTAL NERVE BLOCK Left 06/13/2021   Procedure: INTERCOSTAL NERVE BLOCK;  Surgeon: Kerrin Elspeth BROCKS, MD;  Location: [facility] OR;  Service: Thoracic;  Laterality: Left;   LASER BRONCHOSCOPY N/A 06/13/2021   Procedure: LASER BRONCHOSCOPY;  Surgeon: Kerrin Elspeth BROCKS, MD;  Location: [facility] OR;  Service: Thoracic;  Laterality: N/A;   NODE DISSECTION Left 06/13/2021   Procedure: NODE DISSECTION;  Surgeon: Kerrin Elspeth BROCKS, MD;  Location: [facility] OR;  Service: Thoracic;  Laterality: Left;   VIDEO BRONCHOSCOPY N/A 04/13/2021   Procedure: VIDEO BRONCHOSCOPY WITHOUT FLUORO;  Surgeon: Meade Verdon RAMAN, MD;  Location: [facility];  Service: Pulmonary;  Laterality: N/A;   VIDEO BRONCHOSCOPY N/A 06/13/2021   Procedure: VIDEO BRONCHOSCOPY;  Surgeon: Kerrin Elspeth BROCKS, MD;  Location: [facility] OR;  Service: Thoracic;  Laterality: N/A;   WISDOM TOOTH EXTRACTION         Home Medications    Prior to Admission medications   Medication Sig Start Date End Date Taking? Authorizing Provider  amoxicillin -clavulanate (AUGMENTIN) 875-125 MG tablet Take 1 tablet by mouth 2 (two) times daily after a meal for 7 days. 12/10/23 12/17/23 Yes Leanda Padmore, FNP  azithromycin  (ZITHROMAX ) 250 MG tablet Take 1 tablet (250 mg total) by mouth daily. Take first 2 tablets together, then 1 every day until finished. 12/10/23  Yes Iola Lukes, FNP  brompheniramine-pseudoephedrine-DM 30-2-10 MG/5ML syrup Take 10 mLs by mouth every 6 (six) hours as needed (cough and congestion). 12/10/23  Yes Shyenne Maggard, Lukes, FNP  Dextromethorphan-guaiFENesin (MUCINEX DM MAXIMUM STRENGTH) 60-1200 MG TB12 Take 1 tablet by mouth 2 (two) times daily. 12/10/23  Yes Icyss Skog, FNP  predniSONE  (DELTASONE ) 10 MG tablet Take 4 tablets (40 mg total) by  mouth daily with breakfast for 2 days, THEN 3 tablets (30 mg total) daily with breakfast for 2 days, THEN 2 tablets (20 mg total) daily with breakfast for 2 days, THEN 1 tablet (10 mg total) daily with breakfast for 1 day. Take 4 tablets (40 mg) by mouth each morning on days 1 and 2, then take 3 tablets (30 mg) each morning on days 3 and 4, then take 2 tablets (20 mg) each morning on days 5 and 6, then take 1 tablet (10 mg) in the morning on day 7. 12/10/23 12/17/23 Yes Iola Lukes, FNP  acetaminophen  (TYLENOL ) 325 MG tablet Take 650 mg by mouth every 6 (six) hours as needed for moderate pain.    [provider]  albuterol  (VENTOLIN  HFA) 108 (90 Base) MCG/ACT inhaler Inhale 1-2 puffs into the lungs every 6 (six) hours as needed for wheezing or shortness of breath. 03/31/21   Desai, Nikita S, MD  atorvastatin  (LIPITOR) 20 MG tablet TAKE 1 TABLET BY MOUTH EVERY DAY 08/30/23   Tanda Bleacher, MD  benzonatate  (TESSALON ) 100 MG capsule Take 1 capsule (100 mg total) by mouth every 8 (eight) hours as needed for cough. Patient not taking: Reported on 12/10/2023 11/09/22   Hazen Darryle BRAVO, FNP  buPROPion  (WELLBUTRIN  XL) 150 MG 24 hr  tablet Take 1 tablet (150 mg total) by mouth daily. 01/09/23   Tanda Bleacher, MD  escitalopram  (LEXAPRO ) 10 MG tablet TAKE 1 TABLET BY MOUTH EVERY DAY 08/30/23   Tanda Bleacher, MD  FLONASE  ALLERGY  RELIEF 50 MCG/ACT nasal spray Place 1-2 sprays into both nostrils 2 (two) times daily as needed for allergies or rhinitis.    [provider]  fluticasone -salmeterol (ADVAIR ) 250-50 MCG/ACT AEPB Inhale 1 puff into the lungs in the morning and at bedtime. 04/12/23   Desai, Nikita S, MD  losartan  (COZAAR ) 100 MG tablet TAKE 1 TABLET BY MOUTH EVERY DAY 10/06/22   Tanda Bleacher, MD  mirtazapine  (REMERON ) 30 MG tablet TAKE 1 TABLET BY MOUTH EVERYDAY AT BEDTIME 08/02/23   Danton Jon HERO, PA-C  montelukast  (SINGULAIR ) 10 MG tablet Take 1 tablet (10 mg total) by mouth daily. 04/12/23    Desai, Nikita S, MD  rosuvastatin (CRESTOR) 20 MG tablet Take by mouth. Patient not taking: Reported on 12/10/2023 09/23/15   [provider]    Family History Family History  Problem Relation Age of Onset   Prostate cancer Father    Asthma Son     Social History Social History   Tobacco Use   Smoking status: Never   Smokeless tobacco: Never  Vaping Use   Vaping status: Never Used  Substance Use Topics   Alcohol use: Yes    Comment: socially   Drug use: Never     Allergies   Other   Review of Systems Review of Systems  Constitutional:  Positive for fatigue. Negative for chills, diaphoresis and unexpected weight change.  HENT:  Positive for congestion, nosebleeds (occasionally from right nostril), postnasal drip, rhinorrhea and sore throat.   Respiratory:  Positive for cough (productive, yellow sputum; occasional hemoptysis) and shortness of breath.   Gastrointestinal:  Negative for diarrhea, nausea and vomiting.  Musculoskeletal:  Negative for myalgias.  Neurological:  Positive for light-headedness (mild) and headaches.  All other systems reviewed and are negative.    Physical Exam Triage Vital Signs ED Triage Vitals  Encounter Vitals Group     BP 12/10/23 1350 (!) 150/90     Girls Systolic BP Percentile --      Girls Diastolic BP Percentile --      Boys Systolic BP Percentile --      Boys Diastolic BP Percentile --      Pulse Rate 12/10/23 1350 (!) 104     Resp 12/10/23 1350 18     Temp 12/10/23 1350 98.4 F (36.9 C)     Temp Source 12/10/23 1350 Oral     SpO2 12/10/23 1350 97 %     Weight 12/10/23 1351 240 lb (108.9 kg)     Height 12/10/23 1351 5' 9 (1.753 m)     Head Circumference --      Peak Flow --      Pain Score 12/10/23 1351 0     Pain Loc --      Pain Education --      Exclude from Growth Chart --    No data found.  Updated Vital Signs BP (!) 150/90 (BP Location: Right Arm)   Pulse (!) 104   Temp 98.4 F (36.9 C) (Oral)    Resp 18   Ht 5' 9 (1.753 m)   Wt 240 lb (108.9 kg)   SpO2 97%   BMI 35.44 kg/m   Visual Acuity Right Eye Distance:   Left Eye Distance:   Bilateral Distance:  Right Eye Near:   Left Eye Near:    Bilateral Near:     Physical Exam Vitals reviewed.  Constitutional:      General: He is awake. He is not in acute distress.    Appearance: Normal appearance. He is well-developed. He is not ill-appearing, toxic-appearing or diaphoretic.  HENT:     Head: Normocephalic.     Right Ear: Tympanic membrane, ear canal and external ear normal. No drainage, swelling or tenderness. No middle ear effusion. Tympanic membrane is not erythematous.     Left Ear: Tympanic membrane, ear canal and external ear normal. No drainage, swelling or tenderness.  No middle ear effusion. Tympanic membrane is not erythematous.     Nose: Congestion present. No rhinorrhea.     Mouth/Throat:     Lips: Pink.     Mouth: Mucous membranes are moist.     Pharynx: No pharyngeal swelling, oropharyngeal exudate, posterior oropharyngeal erythema or uvula swelling.     Tonsils: No tonsillar exudate or tonsillar abscesses.  Eyes:     General: Vision grossly intact.     Conjunctiva/sclera: Conjunctivae normal.  Cardiovascular:     Rate and Rhythm: Normal rate.     Heart sounds: Normal heart sounds.  Pulmonary:     Effort: Pulmonary effort is normal. No tachypnea or respiratory distress.     Breath sounds: Normal breath sounds and air entry.     Comments: Diffuse rhonchi are heard throughout all lung fields. Respirations are even and unlabored, and the patient appears in no acute distress.  Musculoskeletal:        General: Normal range of motion.     Cervical back: Normal range of motion and neck supple.  Lymphadenopathy:     Cervical: No cervical adenopathy.  Skin:    General: Skin is warm and dry.  Neurological:     General: No focal deficit present.     Mental Status: He is alert and oriented to person, place,  and time.  Psychiatric:        Behavior: Behavior is cooperative.      UC Treatments / Results  Labs (all labs ordered are listed, but only abnormal results are displayed) Labs Reviewed  POC COVID19/FLU A&B COMBO - Normal    EKG   Radiology DG Chest 2 View Result Date: 12/10/2023 EXAM: 2 VIEW(S) XRAY OF THE CHEST 12/10/2023 03:36:53 PM COMPARISON: Chest x-ray 08/09/2000. CLINICAL HISTORY: 4 day hx cough, sob - Hx neuroendocrine carcinoma status post left upper lobectomy 2023; CT 08/2023 showed no metastatic or recurrent disease. Pt c/o productive cough and sinus drainage x's 4 days Also st's he has been spitting up blood. 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 FINDINGS: LUNGS AND PLEURA: Status post left upper lobectomy 2023. No focal pulmonary opacity. No pulmonary edema. No pleural effusion. No pneumothorax. HEART AND MEDIASTINUM: No acute abnormality of the cardiac and mediastinal silhouettes. BONES AND SOFT TISSUES: No acute osseous abnormality. IMPRESSION: 1. No acute cardiopulmonary disease detected Electronically signed by: Greig Pique MD 12/10/2023 04:05 PM EDT RP Workstation: [id number]    Procedures Procedures (including critical care time)  Medications Ordered in UC Medications  ipratropium-albuterol  (DUONEB) 0.5-2.5 (3) MG/3ML nebulizer solution 3 mL (3 mLs Nebulization Given 12/10/23 1516)    Initial Impression / Assessment and Plan / UC Course  I have reviewed the triage vital signs and the nursing notes.  Pertinent labs & imaging results that were available during my care of the patient were reviewed  by me and considered in my medical decision making (see chart for details).     The patient presents with a four-day history of productive cough with thick, dark yellow sputum and intermittent blood-streaking. Given his past medical history of Stage Ib well-differentiated neuroendocrine carcinoma, status post left upper lobectomy in 2023 with no  evidence of recurrence on his most recent CT in July 2024, concern for possible infection or recurrence was appropriately considered. Associated upper respiratory symptoms include sore throat (improving), nasal congestion, rhinorrhea, post-nasal drainage, and intermittent epistaxis. He is afebrile, nontoxic, alert, and in no acute distress. Pulmonary exam reveals diffuse rhonchi throughout all lung fields without focal findings. Respirations are even and unlabored.  Flu and COVID testing were negative. Chest X-ray shows no focal pulmonary opacity or evidence of acute cardiopulmonary disease. A DuoNeb treatment was given in clinic with improvement in symptoms. Given the clinical presentation, negative imaging, and absence of systemic illness, findings are consistent with acute bronchitis, most likely viral in nature (suspected rhinovirus).  The patient was prescribed appropriate medications for cough and congestion relief and instructed on supportive care measures, including rest, increased hydration, and the use of over-the-counter symptom relief as needed. He was advised to follow up with his primary care provider if symptoms fail to improve within several days or worsen. Emergency precautions were discussed, including increased shortness of breath, chest pain, high or persistent fever, hemoptysis, or any new or concerning symptoms.  Today's evaluation has revealed no signs of a dangerous process. Discussed diagnosis with patient and/or guardian. Patient and/or guardian aware of their diagnosis, possible red flag symptoms to watch out for and need for close follow up. Patient and/or guardian understands verbal and written discharge instructions. Patient and/or guardian comfortable with plan and disposition.  Patient and/or guardian has a clear mental status at this time, good insight into illness (after discussion and teaching) and has clear judgment to make decisions regarding their care  Documentation was  completed with the aid of voice recognition software. Transcription may contain typographical errors. draft patient-friendly discharge instructions based on above that is concise and easy to understand but detailed and without bullets. provide examples of symptom management as well as indications for PCP and/or specialist follow-up as well as indications for ED evaluation. Final Clinical Impressions(s) / UC Diagnoses   Final diagnoses:  Acute cough  Acute bronchitis due to Rhinovirus  Personal history of lung cancer     Discharge Instructions      You were seen today for a cough that has been present for the past few days. Your exam and tests, including a chest X-ray, flu test, and COVID test, did not show any signs of pneumonia or other serious lung problems. Your symptoms are most consistent with acute bronchitis, which is likely caused by a virus. This type of infection typically improves on its own with rest and supportive care.  You received a breathing treatment in the clinic that helped open your airways. Continue to rest, drink plenty of fluids, and use the prescribed medications as directed. Staying well hydrated helps thin mucus and makes it easier to cough up phlegm. Warm liquids such as tea or soup can soothe your throat. You may use over-the-counter cough medicine, throat lozenges, or honey (if not allergic) to ease coughing. Avoid smoking, vaping, or being around smoke or strong fumes, as these can make symptoms worse. Your cough may last for up to two or three weeks but should gradually improve. Follow up with your  primary care provider if your symptoms do not start to improve within several days, or sooner if you experience worsening congestion, persistent fatigue, or any new symptoms. Go to the emergency department right away if you develop chest pain, shortness of breath that worsens or does not go away with rest, coughing up large amounts of blood, a persistent high fever, confusion,  or if you feel generally weak or unwell.     ED Prescriptions     Medication Sig Dispense Auth. Provider   brompheniramine-pseudoephedrine-DM 30-2-10 MG/5ML syrup Take 10 mLs by mouth every 6 (six) hours as needed (cough and congestion). 120 mL Jamilyn Pigeon, [place], FNP   Dextromethorphan-guaiFENesin (MUCINEX DM MAXIMUM STRENGTH) 60-1200 MG TB12 Take 1 tablet by mouth 2 (two) times daily. 20 tablet Iola Lukes, FNP   predniSONE  (DELTASONE ) 10 MG tablet Take 4 tablets (40 mg total) by mouth daily with breakfast for 2 days, THEN 3 tablets (30 mg total) daily with breakfast for 2 days, THEN 2 tablets (20 mg total) daily with breakfast for 2 days, THEN 1 tablet (10 mg total) daily with breakfast for 1 day. Take 4 tablets (40 mg) by mouth each morning on days 1 and 2, then take 3 tablets (30 mg) each morning on days 3 and 4, then take 2 tablets (20 mg) each morning on days 5 and 6, then take 1 tablet (10 mg) in the morning on day 7. 19 tablet Iola Lukes, FNP   amoxicillin -clavulanate (AUGMENTIN) 875-125 MG tablet Take 1 tablet by mouth 2 (two) times daily after a meal for 7 days. 14 tablet Miachel Nardelli, [place], FNP   azithromycin  (ZITHROMAX ) 250 MG tablet Take 1 tablet (250 mg total) by mouth daily. Take first 2 tablets together, then 1 every day until finished. 6 tablet Iola Lukes, FNP      PDMP not reviewed this encounter.   Iola Lukes, [place] 12/10/23 [id number]

## 2024-02-25 ENCOUNTER — Other Ambulatory Visit: Payer: Self-pay | Admitting: Family Medicine

## 2024-02-25 DIAGNOSIS — F411 Generalized anxiety disorder: Secondary | ICD-10-CM

## 2024-03-05 ENCOUNTER — Other Ambulatory Visit: Payer: Self-pay | Admitting: Family Medicine

## 2024-03-24 IMAGING — DX DG CHEST 1V SAME DAY
1 series · 1 of 1 positions shown · non-contrast
Comparison: 06/15/2021 at 8878 hours

CLINICAL DATA: Chest tube removal

EXAM:
CHEST - 1 VIEW SAME DAY

[chest ap]
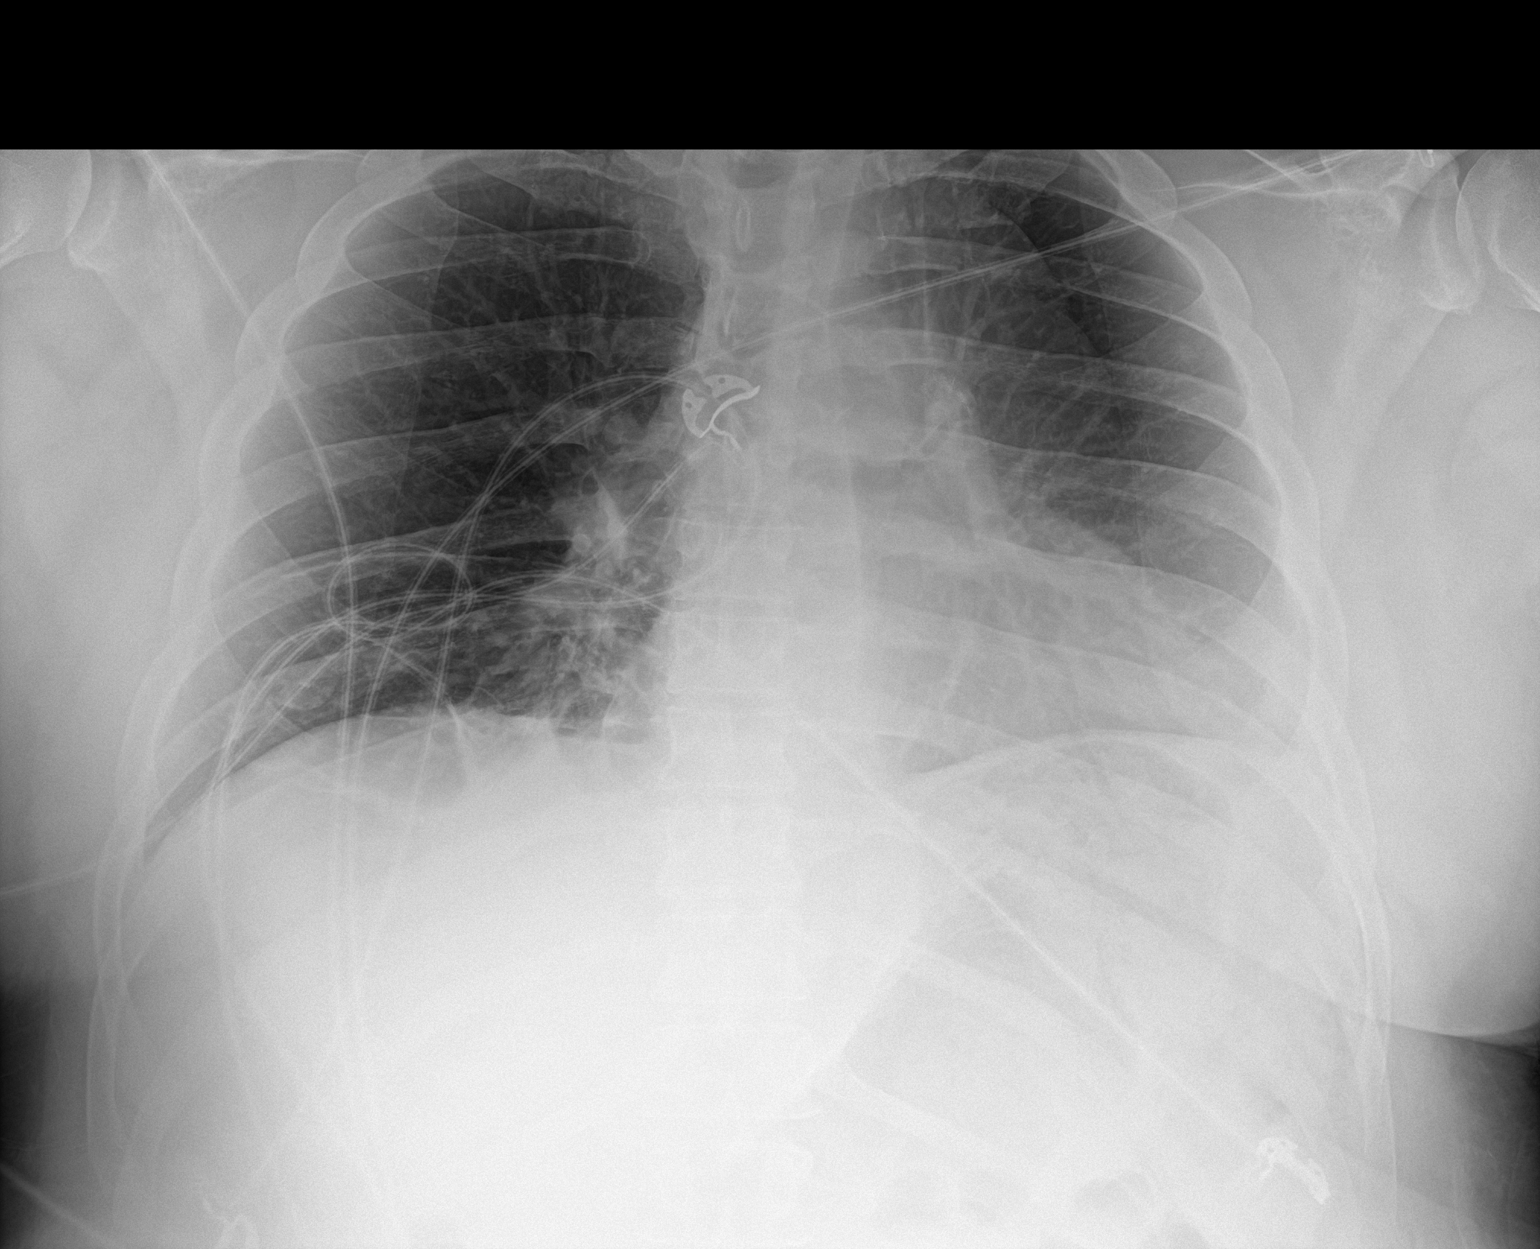

[1 of 1 positions shown; findings below may reference images not displayed]

FINDINGS: Interval removal of left chest tube. Possible trace residual left
pneumothorax, not appreciably changed from prior. Persistent left
upper lobe opacity. Right lung is clear. Heart size is normal.
IMPRESSION: Interval removal of left chest tube. Possible trace residual left
pneumothorax, not appreciably changed from prior.

## 2024-03-24 IMAGING — CR DG CHEST 2V
2 series · 2 of 2 positions shown · non-contrast
Comparison: June 14, 2021

CLINICAL DATA: Status post lobectomy of the lung.

EXAM:
CHEST - 2 VIEW

[chest pa]
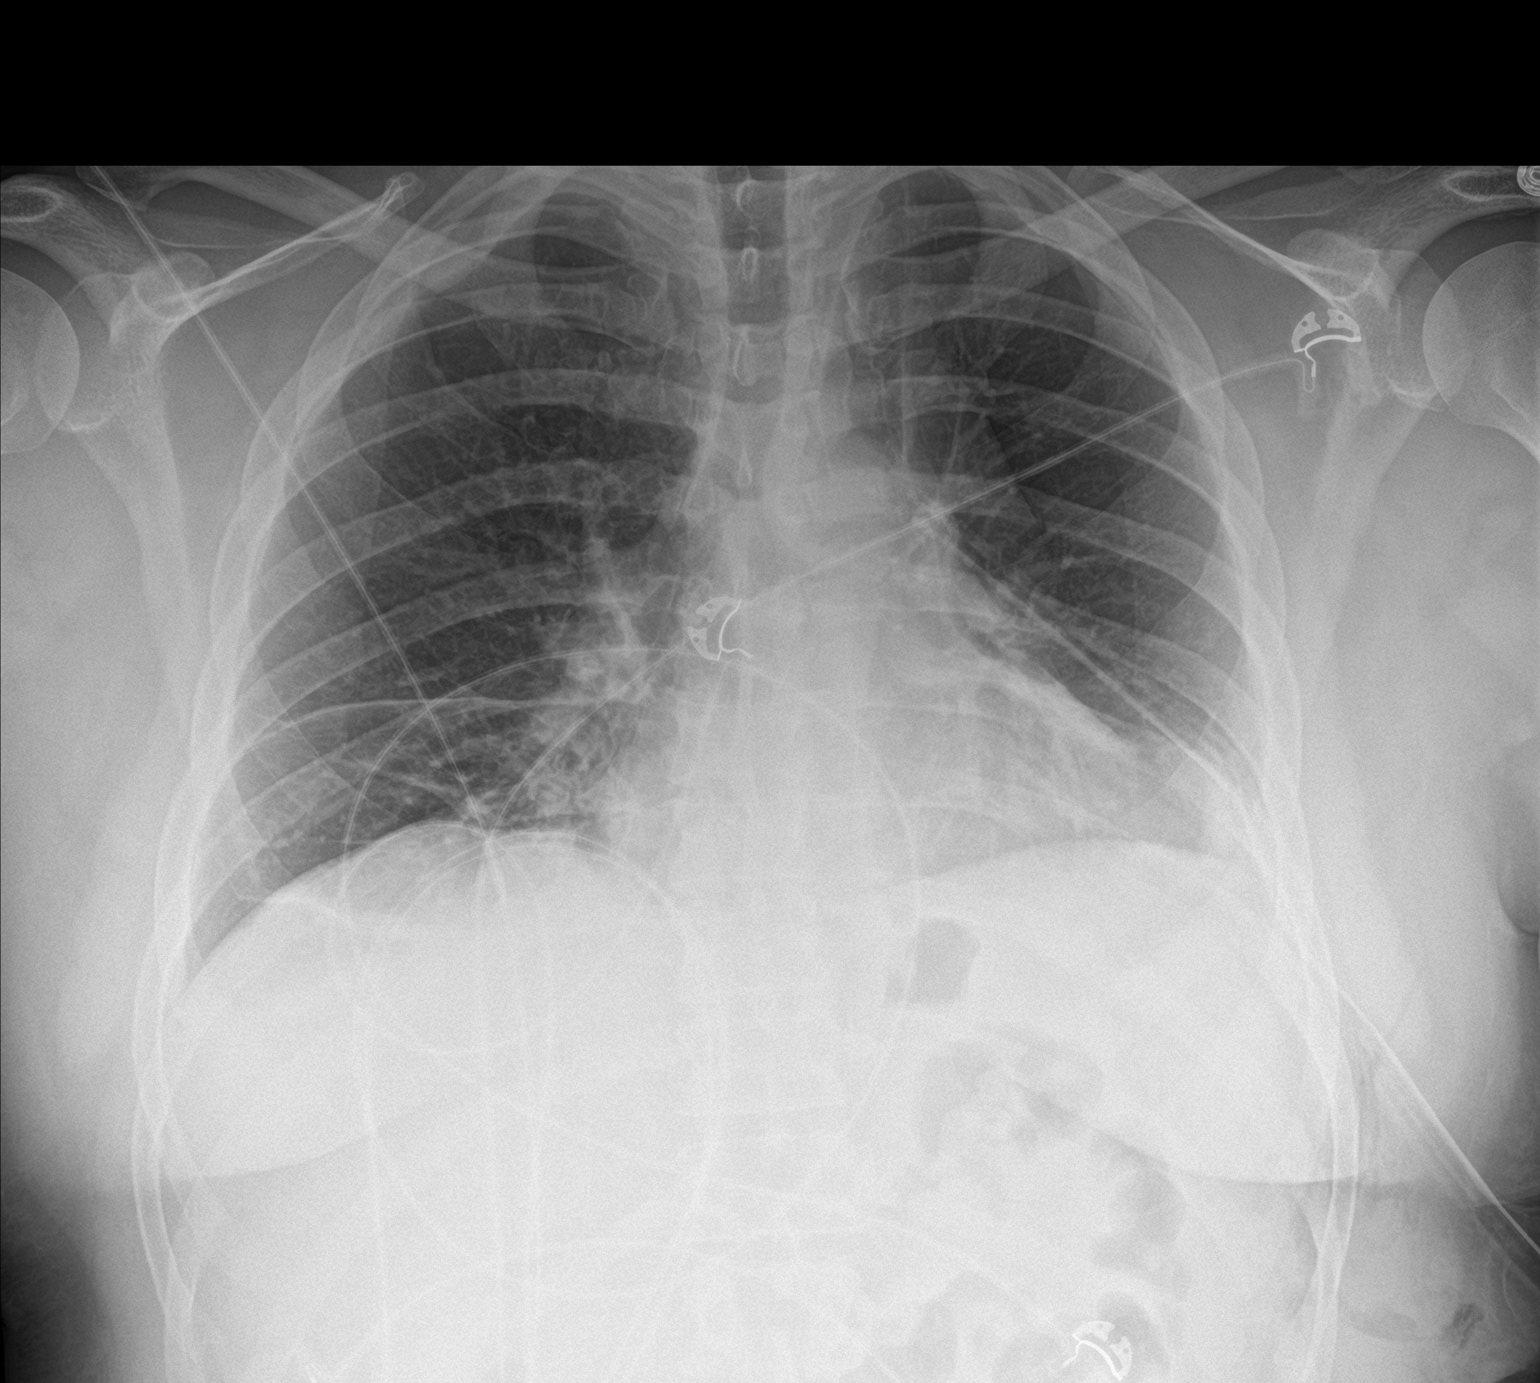

[chest lat]
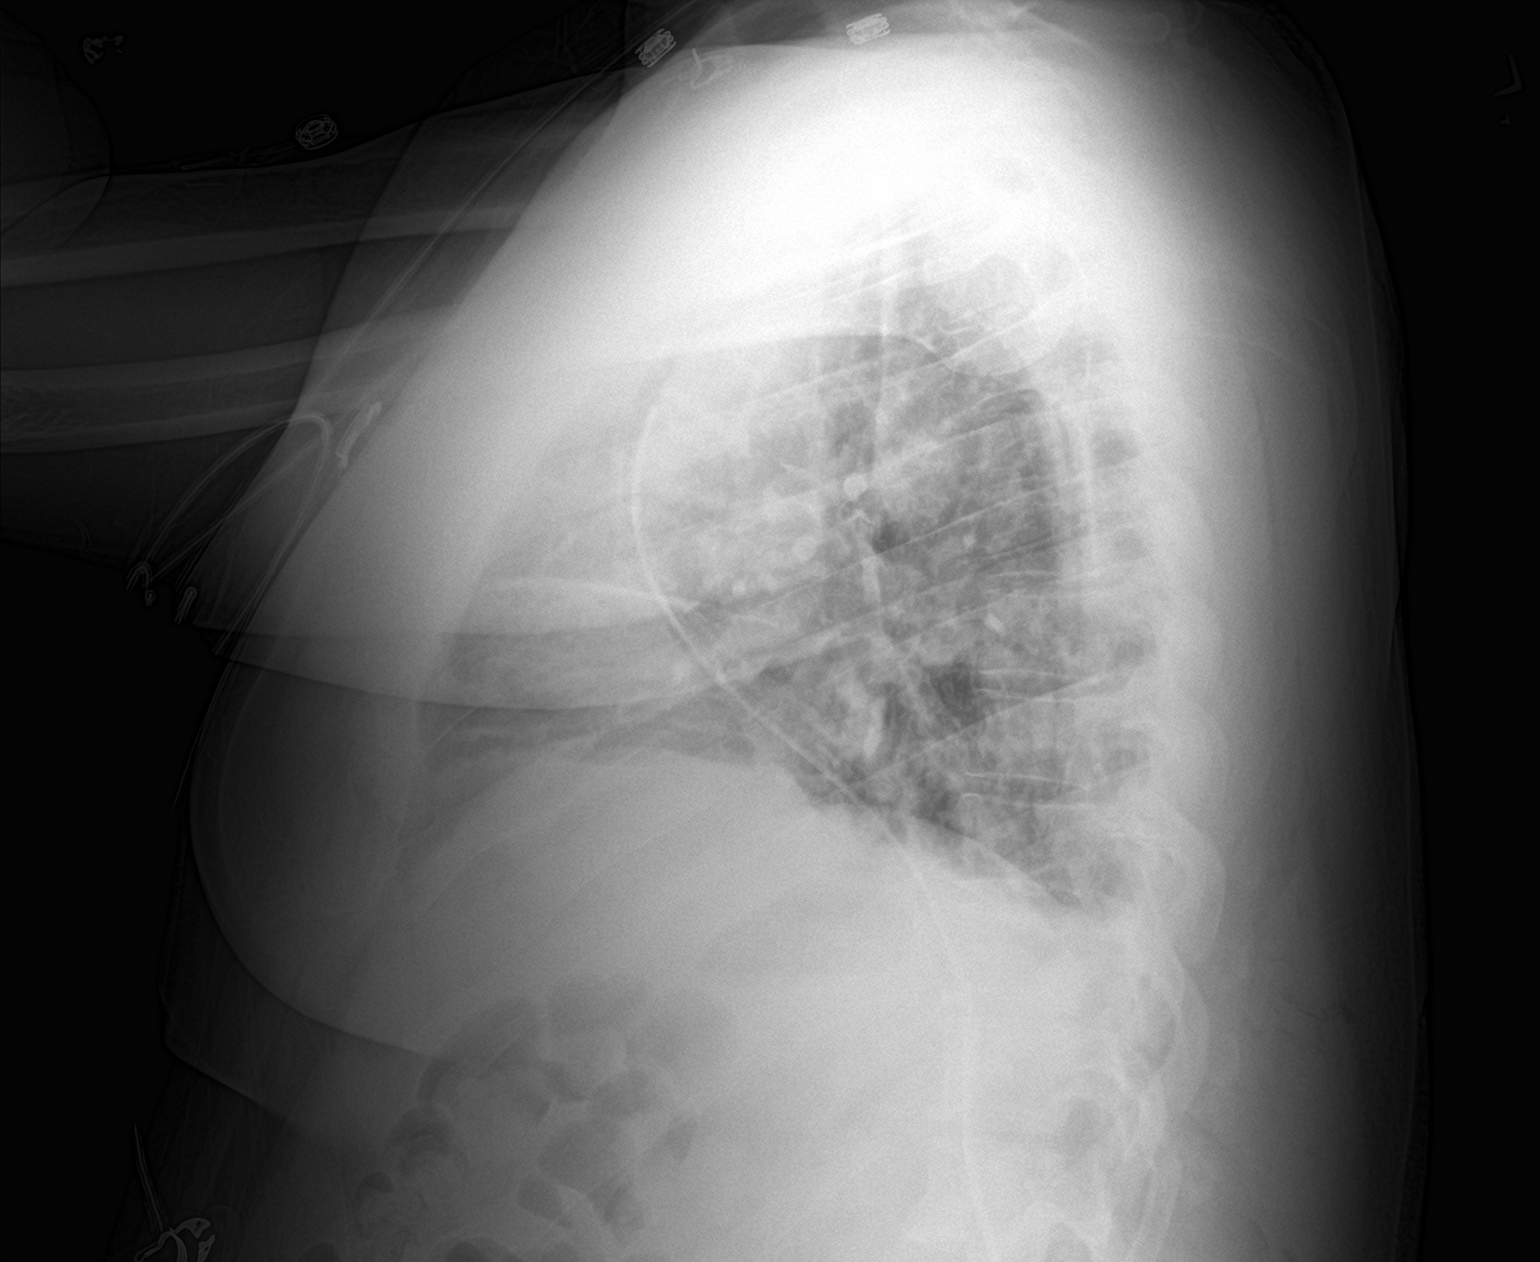

[2 of 2 positions shown; findings below may reference images not displayed]

FINDINGS: Left chest tube is identified unchanged. Pleural line is identified
in the lateral left upper hemithorax suggesting miniscule left
pneumothorax. Mild atelectasis of left lung base is noted. The right
lung is clear. The mediastinal contour and cardiac silhouette are
normal. Osseous structures are stable.
IMPRESSION: Pleural line is identified in the lateral left upper hemithorax
suggesting miniscule left pneumothorax. Mild atelectasis of left
lung base.

## 2024-03-25 IMAGING — CR DG CHEST 2V
2 series · 2 of 2 positions shown · non-contrast
Comparison: Radiographs dated June 15, 2021

CLINICAL DATA: Status post lobectomy of the lung.

EXAM:
CHEST - 2 VIEW

[chest pa]
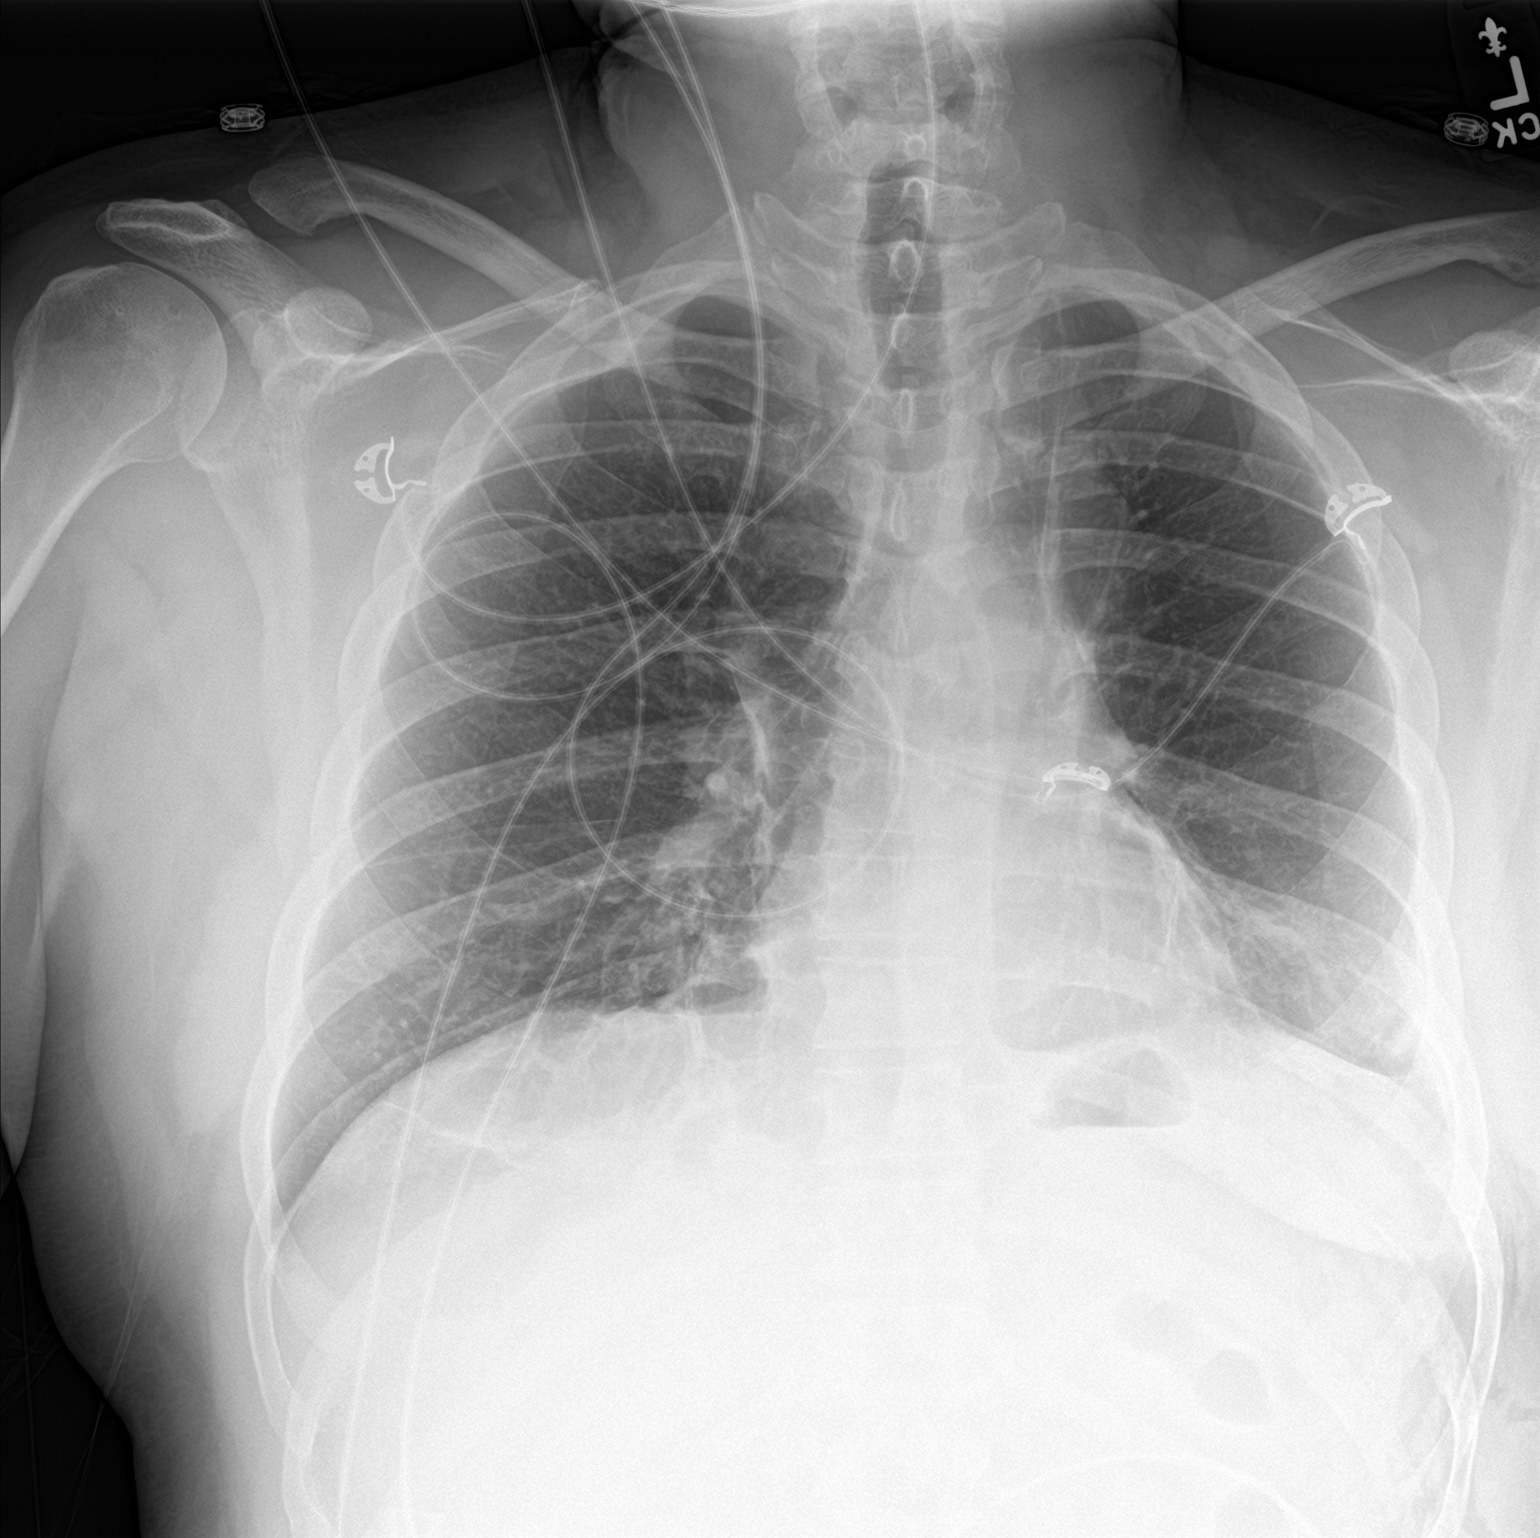

[chest lat]
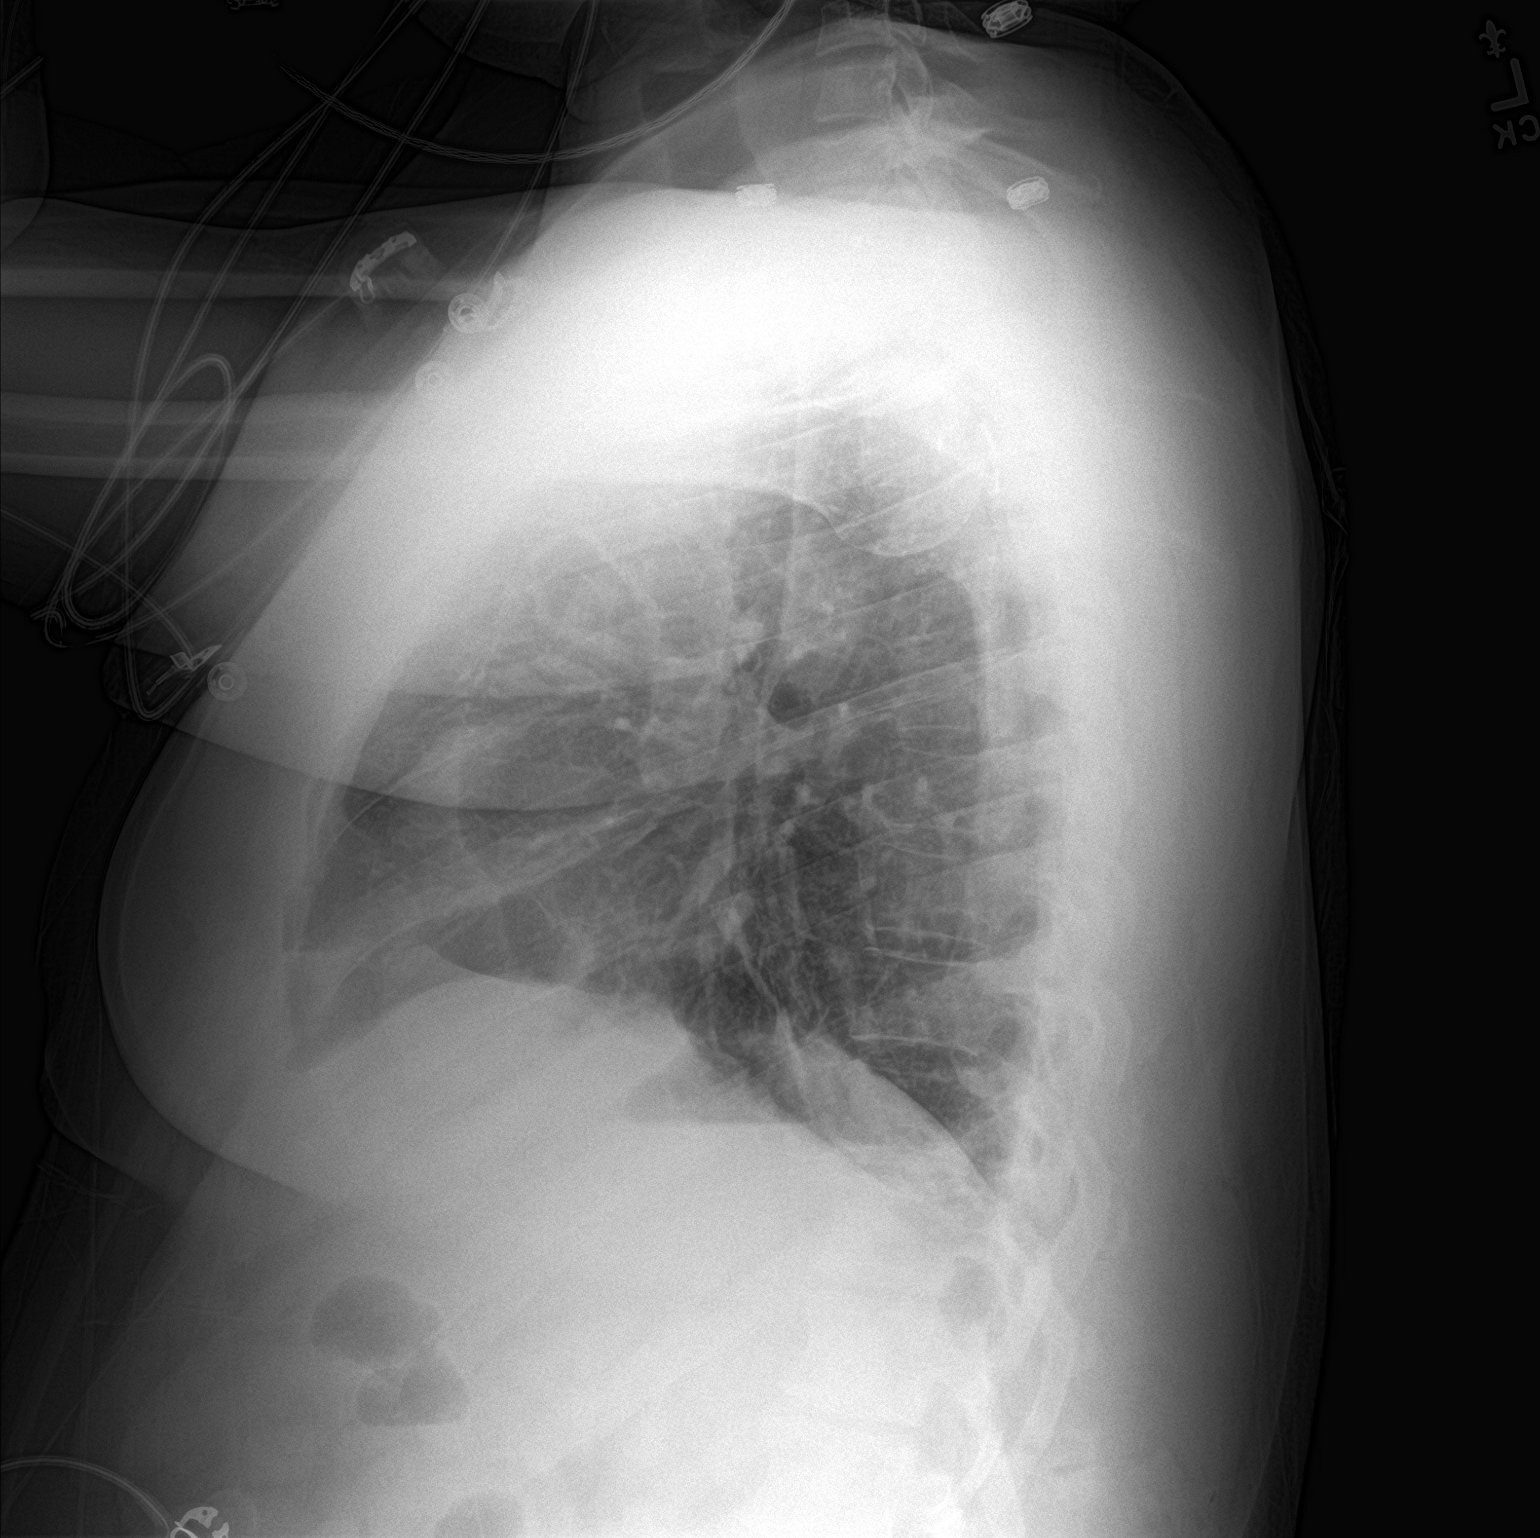

[2 of 2 positions shown; findings below may reference images not displayed]

FINDINGS: The cardiomediastinal silhouette is unchanged. Left basilar
atelectasis is also unchanged. Trace left pneumothorax is unchanged.
The right lung is clear. The osseous structures are unremarkable.
IMPRESSION: No significant interval change or acute cardiopulmonary process.

## 2024-08-27 ENCOUNTER — Other Ambulatory Visit

## 2024-09-03 ENCOUNTER — Ambulatory Visit: Admitting: Internal Medicine
